# Patient Record
Sex: Male | Born: 1985
Health system: Southern US, Community
[De-identification: ages and names within clinical notes are randomized; demographics above are authoritative.]

## PROBLEM LIST (undated history)

## (undated) VITALS — BP 130/82 | HR 65 | Temp 96.7°F | Resp 24

## (undated) DIAGNOSIS — F1010 Alcohol abuse, uncomplicated: Secondary | ICD-10-CM

## (undated) DIAGNOSIS — F32A Depression, unspecified: Secondary | ICD-10-CM

## (undated) DIAGNOSIS — F329 Major depressive disorder, single episode, unspecified: Secondary | ICD-10-CM

## (undated) DIAGNOSIS — F419 Anxiety disorder, unspecified: Secondary | ICD-10-CM

## (undated) DIAGNOSIS — I10 Essential (primary) hypertension: Secondary | ICD-10-CM

## (undated) HISTORY — PX: WISDOM TOOTH EXTRACTION: SHX21

## (undated) HISTORY — DX: Anxiety disorder, unspecified: F41.9

---
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
## 2008-03-23 ENCOUNTER — Emergency Department (HOSPITAL_COMMUNITY): Admission: EM | Admit: 2008-03-23 | Discharge: 2008-03-23 | Payer: Self-pay | Admitting: Family Medicine

## 2009-03-22 ENCOUNTER — Emergency Department (HOSPITAL_COMMUNITY): Admission: EM | Admit: 2009-03-22 | Discharge: 2009-03-22 | Payer: Self-pay | Admitting: Emergency Medicine

## 2009-03-28 ENCOUNTER — Emergency Department (HOSPITAL_COMMUNITY): Admission: EM | Admit: 2009-03-28 | Discharge: 2009-03-28 | Payer: Self-pay | Admitting: Emergency Medicine

## 2010-06-02 ENCOUNTER — Emergency Department (HOSPITAL_COMMUNITY): Admission: EM | Admit: 2010-06-02 | Discharge: 2010-06-02 | Payer: Self-pay | Admitting: Emergency Medicine

## 2010-06-14 ENCOUNTER — Ambulatory Visit: Payer: Self-pay | Admitting: Family Medicine

## 2010-09-09 ENCOUNTER — Other Ambulatory Visit: Payer: Self-pay | Admitting: Family Medicine

## 2010-09-09 ENCOUNTER — Ambulatory Visit
Admission: RE | Admit: 2010-09-09 | Discharge: 2010-09-09 | Payer: Self-pay | Source: Home / Self Care | Attending: Family Medicine | Admitting: Family Medicine

## 2010-09-09 DIAGNOSIS — F524 Premature ejaculation: Secondary | ICD-10-CM | POA: Insufficient documentation

## 2010-09-09 DIAGNOSIS — R030 Elevated blood-pressure reading, without diagnosis of hypertension: Secondary | ICD-10-CM | POA: Insufficient documentation

## 2010-09-09 DIAGNOSIS — F1021 Alcohol dependence, in remission: Secondary | ICD-10-CM | POA: Insufficient documentation

## 2010-09-10 LAB — LIPID PANEL
Cholesterol: 153 mg/dL (ref 0–200)
HDL: 85.9 mg/dL (ref >39.00)
LDL Cholesterol: 62 mg/dL (ref 0–99)
Total CHOL/HDL Ratio: 2
Triglycerides: 24 mg/dL (ref 0.0–149.0)
VLDL: 4.8 mg/dL (ref 0.0–40.0)

## 2010-10-03 NOTE — Assessment & Plan Note (Signed)
Summary: NEW TO EST//ALP   Vital Signs:  Patient profile:   25 year old male Height:      68.5 inches Weight:      148 pounds BMI:     22.26 Temp:     97.9 degrees F oral Pulse rate:   80 / minute Pulse rhythm:   regular Resp:     12 per minute BP sitting:   138 / 90  (left arm) Cuff size:   regular  Vitals Entered By: Sid Falcon LPN (September 09, 2010 3:20 PM)   History of Present Illness: New to establish care.  Pt has no real chronic med problems. No hx of surgeries.   No medications and no allergies. New issue of premature ejaculation.  Normal libido and no ED. Symptom onset about one year ago.  Past hx of ETOH abuse and abstinent for one year.  FH signif for Fa premature CAD.  PT has never had lipids checked.    Preventive Screening-Counseling & Management  Alcohol-Tobacco     Smoking Status: current     Packs/Day: 0.5  Caffeine-Diet-Exercise     Does Patient Exercise: no  Allergies (verified): No Known Drug Allergies  Past History:  Family History: Last updated: 09/09/2010 Father, type ll diabetes, heart disease age 55 Brother, hypertension Paternal grandfather, heart disease age 92  Social History: Last updated: 09/09/2010 Current Smoker Alcohol use-no Past hx ETOH abuse. Regular exercise-no  Risk Factors: Exercise: no (09/09/2010)  Risk Factors: Smoking Status: current (09/09/2010) Packs/Day: 0.5 (09/09/2010)  Past Medical History: Past hx of ETOH abuse PMH-FH-SH reviewed for relevance  Family History: Father, type ll diabetes, heart disease age 62 Brother, hypertension Paternal grandfather, heart disease age 13  Social History: Current Smoker Alcohol use-no Past hx ETOH abuse. Regular exercise-no Smoking Status:  current Packs/Day:  0.5 Does Patient Exercise:  no  Review of Systems  The patient denies anorexia, fever, weight loss, weight gain, hoarseness, chest pain, syncope, dyspnea on exertion, peripheral edema,  prolonged cough, headaches, hemoptysis, abdominal pain, melena, hematochezia, severe indigestion/heartburn, incontinence, muscle weakness, and depression.    Physical Exam  General:  Well-developed,well-nourished,in no acute distress; alert,appropriate and cooperative throughout examination Head:  Normocephalic and atraumatic without obvious abnormalities. No apparent alopecia or balding. Eyes:  pupils equal, pupils round, and pupils reactive to light.   Ears:  External ear exam shows no significant lesions or deformities.  Otoscopic examination reveals clear canals, tympanic membranes are intact bilaterally without bulging, retraction, inflammation or discharge. Hearing is grossly normal bilaterally. Mouth:  Oral mucosa and oropharynx without lesions or exudates.  Teeth in good repair. Neck:  No deformities, masses, or tenderness noted. Lungs:  Normal respiratory effort, chest expands symmetrically. Lungs are clear to auscultation, no crackles or wheezes. Heart:  Normal rate and regular rhythm. S1 and S2 normal without gallop, murmur, click, rub or other extra sounds. Abdomen:  soft, non-tender, and normal bowel sounds.   Extremities:  No clubbing, cyanosis, edema, or deformity noted with normal full range of motion of all joints.   Neurologic:  alert & oriented X3, cranial nerves II-XII intact, and strength normal in all extremities.   Skin:  no rashes and no suspicious lesions.   Cervical Nodes:  No lymphadenopathy noted   Impression & Recommendations:  Problem # 1:  ELEVATED BLOOD PRESSURE (ICD-796.2) discussed Na reduction and close monitoring Orders: TLB-Lipid Panel (80061-LIPID) Specimen Handling (16109) Venipuncture (60454)  Problem # 2:  PREMATURE EJACULATION (ICD-302.75) trial of Fluoxetine 10  mg daily and titrate in 2-3 weeks as needed  Problem # 3:  ALCOHOL ABUSE, HX OF (ICD-V11.3)  Complete Medication List: 1)  Fluoxetine Hcl 10 Mg Caps (Fluoxetine hcl) .... One by  mouth once daily  Patient Instructions: 1)  Check your  Blood Pressure regularly . If it is above: 130/80  you should make an appointment. 2)  Limit your Sodium(salt) .  3)  Stop smoking tips: Choose a quit date. Cut down before the quit date. Decide what you will do as a substitute when you feel the urge to smoke(gum, toothpick, exercise).  4)  Call in 2-3 weeks if premature ejaculation not improving. Prescriptions: FLUOXETINE HCL 10 MG CAPS (FLUOXETINE HCL) one by mouth once daily  #30 x 5   Entered and Authorized by:   Evelena Peat MD   Signed by:   Evelena Peat MD on 09/09/2010   Method used:   Electronically to        [facility] (retail)       [address]       [place], [place]  [address]       Ph: [phone]       Fax: [phone]   RxID:   [id number]    Orders Added: 1)  TLB-Lipid Panel [80061-LIPID] 2)  Specimen Handling [99000] 3)  Venipuncture [16606] 4)  New Patient Level III [30160]    Preventive Care Screening  Last Tetanus Booster:    Date:  09/01/2009    Results:  Historical

## 2011-12-02 ENCOUNTER — Emergency Department (HOSPITAL_COMMUNITY)
Admission: EM | Admit: 2011-12-02 | Discharge: 2011-12-03 | Disposition: A | Discharge status: Other Acute Inpatient Hospital | Payer: 59 | Attending: Emergency Medicine | Admitting: Emergency Medicine

## 2011-12-02 ENCOUNTER — Encounter (HOSPITAL_COMMUNITY): Payer: Self-pay

## 2011-12-02 DIAGNOSIS — F1021 Alcohol dependence, in remission: Secondary | ICD-10-CM

## 2011-12-02 DIAGNOSIS — F1010 Alcohol abuse, uncomplicated: Secondary | ICD-10-CM | POA: Insufficient documentation

## 2011-12-02 DIAGNOSIS — R51 Headache: Secondary | ICD-10-CM | POA: Insufficient documentation

## 2011-12-02 DIAGNOSIS — R259 Unspecified abnormal involuntary movements: Secondary | ICD-10-CM | POA: Insufficient documentation

## 2011-12-02 LAB — RAPID URINE DRUG SCREEN, HOSP PERFORMED
Amphetamines: NOT DETECTED
Barbiturates: NOT DETECTED
Benzodiazepines: NOT DETECTED
Cocaine: NOT DETECTED
Opiates: NOT DETECTED
Tetrahydrocannabinol: NOT DETECTED

## 2011-12-02 LAB — COMPREHENSIVE METABOLIC PANEL
ALT: 62 U/L — ABNORMAL HIGH (ref 0–53)
AST: 96 U/L — ABNORMAL HIGH (ref 0–37)
Albumin: 4.3 g/dL (ref 3.5–5.2)
Alkaline Phosphatase: 65 U/L (ref 39–117)
BUN: 7 mg/dL (ref 6–23)
CO2: 31 mEq/L (ref 19–32)
Calcium: 9.8 mg/dL (ref 8.4–10.5)
Chloride: 97 mEq/L (ref 96–112)
Creatinine, Ser: 0.88 mg/dL (ref 0.50–1.35)
GFR calc Af Amer: >90 mL/min (ref >90)
GFR calc non Af Amer: >90 mL/min (ref >90)
Glucose, Bld: 96 mg/dL (ref 70–99)
Potassium: 3.7 mEq/L (ref 3.5–5.1)
Sodium: 136 mEq/L (ref 135–145)
Total Bilirubin: 0.3 mg/dL (ref 0.3–1.2)
Total Protein: 7.6 g/dL (ref 6.0–8.3)

## 2011-12-02 LAB — ETHANOL: Alcohol, Ethyl (B): <11 mg/dL (ref 0–11)

## 2011-12-02 LAB — CBC
HCT: 39.8 % (ref 39.0–52.0)
Hemoglobin: 13.8 g/dL (ref 13.0–17.0)
MCH: 33.9 pg (ref 26.0–34.0)
MCHC: 34.7 g/dL (ref 30.0–36.0)
MCV: 97.8 fL (ref 78.0–100.0)
Platelets: 221 10*3/uL (ref 150–400)
RBC: 4.07 MIL/uL — ABNORMAL LOW (ref 4.22–5.81)
RDW: 12.9 % (ref 11.5–15.5)
WBC: 8.4 10*3/uL (ref 4.0–10.5)

## 2011-12-02 MED ORDER — FOLIC ACID 1 MG PO TABS
1.0000 mg | ORAL_TABLET | Freq: Every day | ORAL | Status: DC
  Administered 2011-12-02: 1 mg via ORAL
  Filled 2011-12-02: qty 1

## 2011-12-02 MED ORDER — VITAMIN B-1 100 MG PO TABS
100.0000 mg | ORAL_TABLET | Freq: Every day | ORAL | Status: DC
  Administered 2011-12-02: 100 mg via ORAL
  Filled 2011-12-02: qty 1

## 2011-12-02 MED ORDER — LORAZEPAM 1 MG PO TABS: 1.0000 mg | ORAL_TABLET | Freq: Four times a day (QID) | ORAL | Status: DC | PRN

## 2011-12-02 MED ORDER — THIAMINE HCL 100 MG/ML IJ SOLN: 100.0000 mg | Freq: Every day | INTRAMUSCULAR | Status: DC

## 2011-12-02 MED ORDER — LORAZEPAM 2 MG/ML IJ SOLN: 1.0000 mg | Freq: Four times a day (QID) | INTRAMUSCULAR | Status: DC | PRN

## 2011-12-02 MED ORDER — ONDANSETRON HCL 4 MG PO TABS: 4.0000 mg | ORAL_TABLET | Freq: Three times a day (TID) | ORAL | Status: DC | PRN

## 2011-12-02 MED ORDER — ADULT MULTIVITAMIN W/MINERALS CH
1.0000 | ORAL_TABLET | Freq: Every day | ORAL | Status: DC
  Administered 2011-12-02: 1 via ORAL
  Filled 2011-12-02: qty 1

## 2011-12-02 MED ORDER — NICOTINE 21 MG/24HR TD PT24
21.0000 mg | MEDICATED_PATCH | Freq: Every day | TRANSDERMAL | Status: DC
  Filled 2011-12-02: qty 1

## 2011-12-02 MED ORDER — ZOLPIDEM TARTRATE 5 MG PO TABS: 5.0000 mg | ORAL_TABLET | Freq: Every evening | ORAL | Status: DC | PRN

## 2011-12-02 MED ORDER — LORAZEPAM 1 MG PO TABS
1.0000 mg | ORAL_TABLET | Freq: Three times a day (TID) | ORAL | Status: DC | PRN
  Administered 2011-12-02: 1 mg via ORAL
  Filled 2011-12-02: qty 1

## 2011-12-02 NOTE — ED Notes (Signed)
Pt here voluntarily for alcohol detox, last drink last night, pt not suicidal

## 2011-12-02 NOTE — BH Assessment (Signed)
Assessment Note   Jordan Hudson is an 26 y.o. male. Pt reported to the [facility] for detox from alcohol. Pt states that he would like to enter a residential program at [facility] and was instructed to detox first since he was experiencing withdrawal symptoms. Pt presents as very pleasant and calm during assessment. Pt states that he has been drinking 3-4 40oz beers daily for the past 8 months. Pt reports using alcohol since the age of 5 with the longest period of sobriety being 1 yr (2010-2011). Pt's last use of alcohol was 12/01/11 in the amount of approximately 100oz of malt liquor. Pt reports that he stopped drinking around 11pm on 12/01/11. Pt's current withdrawal symptoms include: pressure on chest, shaking, headache and chills. Pt denies using any other substances. Pt states that the increase in alcohol was triggered by the break up with his girlfriend/mother of his 2 y/o daughter. Pt also reports feelings of depression over the last 8 months with symptoms that include: insomnia, loss of interest in usual pleasures and isolating himself from others. Pt states that he is only able to sleep when he drinks alcohol and over the last 8 months he has only slept 4 hours nightly. Pt appears motivated for treatment stating, "I'm tired of feeling this way and I am starting to worry about my health because of the alcohol." Pt denies any SI/HI/AVH.  Pt states that he is currently on a 3 yr probation for 2 DUI's he was charged with in 2009 and 2010. Pt states that he has 7 months left on probation and he has no new/pending charges or court dates.    Pt information is being sent to [facility] for review.   Axis I: Alcohol Dependence, Mood Disorder NOS Axis II: Deferred Axis III: History reviewed. No pertinent past medical history. Axis IV: other psychosocial or environmental problems and problems related to social environment Axis V: 61-70 mild symptoms  Past Medical History: History reviewed. No pertinent past medical  history.  History reviewed. No pertinent past surgical history.  Family History: History reviewed. No pertinent family history.  Social History:  does not have a smoking history on file. He does not have any smokeless tobacco history on file. He reports that he drinks alcohol. His drug history not on file.  Additional Social History:    Allergies: No Known Allergies  Home Medications:  Medications Prior to Admission  Medication Dose Route Frequency Provider Last Rate Last Dose  . folic acid (FOLVITE) tablet 1 mg  1 mg Oral Daily Heather Van Wingen, PA-C      . LORazepam (ATIVAN) tablet 1 mg  1 mg Oral Q6H PRN Pascal Lux Wingen, PA-C       Or  . LORazepam (ATIVAN) injection 1 mg  1 mg Intravenous Q6H PRN Pascal Lux Wingen, PA-C      . LORazepam (ATIVAN) tablet 1 mg  1 mg Oral Q8H PRN Pascal Lux Wingen, PA-C      . mulitivitamin with minerals tablet 1 tablet  1 tablet Oral Daily Heather Van Wingen, PA-C      . nicotine (NICODERM CQ - dosed in mg/24 hours) patch 21 mg  21 mg Transdermal Daily Heather Van Wingen, PA-C      . ondansetron [facility]) tablet 4 mg  4 mg Oral Q8H PRN Pascal Lux Wingen, PA-C      . thiamine (VITAMIN B-1) tablet 100 mg  100 mg Oral Daily Heather Anne Shutter, PA-C  Or  . thiamine (B-1) injection 100 mg  100 mg Intravenous Daily Heather Van Wingen, PA-C      . zolpidem (AMBIEN) tablet 5 mg  5 mg Oral QHS PRN Pascal Lux Wingen, PA-C       No current outpatient prescriptions on file as of 12/02/2011.    OB/GYN Status:  No LMP for male patient.  General Assessment Data Location of Assessment: [facility] ED Living Arrangements: Parent (sister also in the home) Can pt return to current living arrangement?: Yes Admission Status: Voluntary Is patient capable of signing voluntary admission?: Yes Transfer from: Acute Hospital Referral Source: Self/Family/Friend  Education Status Is patient currently in school?: No  Risk to self Suicidal Ideation: No Suicidal  Intent: No Is patient at risk for suicide?: No Suicidal Plan?: No Access to Means: No What has been your use of drugs/alcohol within the last 12 months?: ETOH: 3-4 40oz beers daily for 8 months; Last use 12/02/11 100oz malt liquor Previous Attempts/Gestures: No How many times?: 0  Other Self Harm Risks: none reported Triggers for Past Attempts: None known Intentional Self Injurious Behavior: None Family Suicide History: No Recent stressful life event(s): Other (Comment) (breakup with girlfriend 8 months ago) Persecutory voices/beliefs?: No Depression: Yes Depression Symptoms: Insomnia;Isolating;Loss of interest in usual pleasures Substance abuse history and/or treatment for substance abuse?: Yes Suicide prevention information given to non-admitted patients: Not applicable  Risk to Others Homicidal Ideation: No Thoughts of Harm to Others: No Current Homicidal Intent: No Current Homicidal Plan: No Access to Homicidal Means: No Identified Victim: none reported History of harm to others?: No Assessment of Violence: None Noted Violent Behavior Description: pt calm and cooperative during assessment Does patient have access to weapons?: No Criminal Charges Pending?: No Does patient have a court date: No  Psychosis Hallucinations: None noted Delusions: None noted  Mental Status Report Appear/Hygiene:  (appropriate to circumstances) Eye Contact: Good Motor Activity: Unremarkable Speech: Logical/coherent Level of Consciousness: Alert Mood: Anxious;Other (Comment) (appropriate to circumstances) Affect: Appropriate to circumstance Anxiety Level: Minimal Thought Processes: Relevant;Coherent Judgement: Unimpaired Orientation: Person;Place;Time;Situation Obsessive Compulsive Thoughts/Behaviors: None  Cognitive Functioning Concentration: Normal Memory: Recent Intact;Remote Intact IQ: Average Insight: Good Impulse Control: Fair Appetite: Good Weight Loss: 0  Weight Gain: 0    Sleep: Decreased Total Hours of Sleep: 4  (4 hrs nightly x8 months) Vegetative Symptoms: None  Prior Inpatient Therapy Prior Inpatient Therapy: Yes Prior Therapy Dates: 2010 Prior Therapy Facilty/Provider(s): [facility]30 day program Reason for Treatment: substance abuse  Prior Outpatient Therapy Prior Outpatient Therapy: Yes Prior Therapy Dates: March 2013-present Prior Therapy Facilty/Provider(s): Dr. Rosalyn Gess at [facility] Reason for Treatment: substance abuse            Values / Beliefs Cultural Requests During Hospitalization: None Spiritual Requests During Hospitalization: None        Additional Information 1:1 In Past 12 Months?: No CIRT Risk: No Elopement Risk: No Does patient have medical clearance?: Yes     Disposition:  Disposition Disposition of Patient: Inpatient treatment program;Referred to [facility]) Type of inpatient treatment program: Adult Patient referred to: Other (Comment) [facility])  On Site Evaluation by:   Reviewed with Physician:     N[place]ada Crane F 12/02/2011 10:48 PM

## 2011-12-02 NOTE — ED Notes (Signed)
Pt reports drinking 100 oz of malt liquor last night, stopped drinking around approx 11 pm. Drinks 3-4 40 oz beers daily x 8 mo, denies SI/HI/AVH, requesting ETOH detox.

## 2011-12-02 NOTE — ED Notes (Signed)
Pt has been changed and wanded, has two belonging bags in locker 35

## 2011-12-02 NOTE — ED Notes (Signed)
SW in to speak with pt.

## 2011-12-02 NOTE — ED Notes (Signed)
Pt called [facility] & states "they told me to come detox 'cause I'm going in withdrawals"

## 2011-12-02 NOTE — ED Provider Notes (Signed)
History     CSN: [id number]  Arrival date & time 12/02/11  1818   First MD Initiated Contact with Patient 12/02/11 2113      Chief Complaint  Patient presents with  . Drug / Alcohol Assessment    (Consider location/radiation/quality/duration/timing/severity/associated sxs/prior treatment) HPI Comments: Patient is here requesting detox from alcohol.  He called [facility] to start an inpatient rehab program and was told that he needed to go through detox first.  He reports that he drinks 3-4 40oz bottles of malt liquor/day for the past 8 months.  He reports that he has been drinking alcohol regularly for the past 8 years, but drinking has escalated over the past 8 months.  He has never gone through detox before and has never experienced alcohol withdrawal before.  He is currently having a headache and tremors.  He reports that the last alcohol drink that he had was at 11pm last evening.  He denies any recreational drug use.  He denies any SI or HI.  Patient is a 26 y.o. male presenting with drug/alcohol assessment. The history is provided by the patient.  Drug / Alcohol Assessment Primary symptoms include no confusion, no loss of consciousness, no seizures, no agitation, no hallucinations and no self-injury. Suspected agents include alcohol. Pertinent negatives include no fever, no nausea and no vomiting.    History reviewed. No pertinent past medical history.  History reviewed. No pertinent past surgical history.  History reviewed. No pertinent family history.  History  Substance Use Topics  . Smoking status: Not on file  . Smokeless tobacco: Not on file  . Alcohol Use: Yes      Review of Systems  Constitutional: Negative for fever, chills and diaphoresis.  Eyes: Negative for visual disturbance.  Respiratory: Negative for shortness of breath.   Cardiovascular: Negative for chest pain.  Gastrointestinal: Negative for nausea and vomiting.  Neurological: Positive for tremors and  headaches. Negative for seizures and loss of consciousness.  Psychiatric/Behavioral: Negative for suicidal ideas, hallucinations, confusion, self-injury, dysphoric mood and agitation. The patient is not nervous/anxious.     Allergies  Review of patient's allergies indicates no known allergies.  Home Medications   Current Outpatient Rx  Name Route Sig Dispense Refill  . IBUPROFEN 200 MG PO TABS Oral Take 800 mg by mouth every 6 (six) hours as needed.      BP 161/93  Pulse 73  Temp(Src) 98.8 F (37.1 C) (Oral)  Resp 18  SpO2 98%  Physical Exam  Nursing note and vitals reviewed. Constitutional: He appears well-developed and well-nourished. No distress.  HENT:  Head: Normocephalic and atraumatic.  Mouth/Throat: Oropharynx is clear and moist.  Eyes: EOM are normal. Pupils are equal, round, and reactive to light.  Neck: Normal range of motion. Neck supple.  Cardiovascular: Normal rate, regular rhythm and normal heart sounds.   Pulmonary/Chest: Effort normal and breath sounds normal.  Musculoskeletal: Normal range of motion.  Neurological: He is alert.  Skin: Skin is warm and dry. He is not diaphoretic.  Psychiatric: He has a normal mood and affect. His speech is normal and behavior is normal. Thought content normal. His mood appears not anxious. He is not actively hallucinating. Cognition and memory are normal. He does not exhibit a depressed mood. He expresses no homicidal and no suicidal ideation. He expresses no suicidal plans and no homicidal plans.    ED Course  Procedures (including critical care time)  Labs Reviewed  CBC - Abnormal; Notable for the following:  RBC 4.07 (*)    All other components within normal limits  COMPREHENSIVE METABOLIC PANEL - Abnormal; Notable for the following:    AST 96 (*) NO VISIBLE HEMOLYSIS   ALT 62 (*)    All other components within normal limits  ETHANOL  URINE RAPID DRUG SCREEN (HOSP PERFORMED)   No results found.   No  diagnosis found.  Patient discussed with ACT team, who will evaluate patient and determine placement.  MDM  Patient comes in today requesting alcohol detox.  ACT team has been consulted and will determine placement for patient.  VSS at this time.  Patient put on CIWA protocol and placed in the Psych ED.        Pascal Lux Arthur, PA-C 12/03/11 0045

## 2011-12-03 ENCOUNTER — Inpatient Hospital Stay (HOSPITAL_COMMUNITY)
Admission: EM | Admit: 2011-12-03 | Discharge: 2011-12-05 | DRG: 897 | Disposition: A | Discharge status: Home / Self Care | Payer: 59 | Source: Ambulatory Visit | Attending: Psychiatry | Admitting: Psychiatry

## 2011-12-03 ENCOUNTER — Encounter (HOSPITAL_COMMUNITY): Payer: Self-pay | Admitting: *Deleted

## 2011-12-03 DIAGNOSIS — Z6379 Other stressful life events affecting family and household: Secondary | ICD-10-CM

## 2011-12-03 DIAGNOSIS — F3289 Other specified depressive episodes: Secondary | ICD-10-CM

## 2011-12-03 DIAGNOSIS — F1021 Alcohol dependence, in remission: Secondary | ICD-10-CM

## 2011-12-03 DIAGNOSIS — F329 Major depressive disorder, single episode, unspecified: Secondary | ICD-10-CM

## 2011-12-03 DIAGNOSIS — F1020 Alcohol dependence, uncomplicated: Secondary | ICD-10-CM | POA: Diagnosis present

## 2011-12-03 DIAGNOSIS — F1010 Alcohol abuse, uncomplicated: Principal | ICD-10-CM

## 2011-12-03 HISTORY — DX: Depression, unspecified: F32.A

## 2011-12-03 HISTORY — DX: Major depressive disorder, single episode, unspecified: F32.9

## 2011-12-03 MED ORDER — LOPERAMIDE HCL 2 MG PO CAPS: 2.0000 mg | ORAL_CAPSULE | ORAL | Status: DC | PRN

## 2011-12-03 MED ORDER — CHLORDIAZEPOXIDE HCL 25 MG PO CAPS
25.0000 mg | ORAL_CAPSULE | Freq: Three times a day (TID) | ORAL | Status: AC
  Administered 2011-12-04 (×3): 25 mg via ORAL
  Filled 2011-12-03 (×3): qty 1

## 2011-12-03 MED ORDER — CHLORDIAZEPOXIDE HCL 25 MG PO CAPS: 25.0000 mg | ORAL_CAPSULE | Freq: Every day | ORAL | Status: DC

## 2011-12-03 MED ORDER — CHLORDIAZEPOXIDE HCL 25 MG PO CAPS
25.0000 mg | ORAL_CAPSULE | ORAL | Status: DC
  Administered 2011-12-05: 25 mg via ORAL
  Filled 2011-12-03: qty 1

## 2011-12-03 MED ORDER — HYDROXYZINE HCL 25 MG PO TABS: 25.0000 mg | ORAL_TABLET | Freq: Four times a day (QID) | ORAL | Status: DC | PRN

## 2011-12-03 MED ORDER — VITAMIN B-1 100 MG PO TABS
100.0000 mg | ORAL_TABLET | Freq: Every day | ORAL | Status: DC
  Administered 2011-12-04 – 2011-12-05 (×2): 100 mg via ORAL
  Filled 2011-12-03 (×4): qty 1

## 2011-12-03 MED ORDER — ADULT MULTIVITAMIN W/MINERALS CH
1.0000 | ORAL_TABLET | Freq: Every day | ORAL | Status: DC
  Administered 2011-12-03 – 2011-12-05 (×3): 1 via ORAL
  Filled 2011-12-03 (×5): qty 1

## 2011-12-03 MED ORDER — ONDANSETRON 4 MG PO TBDP: 4.0000 mg | ORAL_TABLET | Freq: Four times a day (QID) | ORAL | Status: DC | PRN

## 2011-12-03 MED ORDER — TRAZODONE HCL 50 MG PO TABS
50.0000 mg | ORAL_TABLET | Freq: Once | ORAL | Status: DC
  Filled 2011-12-03: qty 1

## 2011-12-03 MED ORDER — MAGNESIUM HYDROXIDE 400 MG/5ML PO SUSP: 30.0000 mL | Freq: Every day | ORAL | Status: DC | PRN

## 2011-12-03 MED ORDER — ACETAMINOPHEN 325 MG PO TABS
650.0000 mg | ORAL_TABLET | Freq: Four times a day (QID) | ORAL | Status: DC | PRN
  Administered 2011-12-04 – 2011-12-05 (×2): 650 mg via ORAL
  Filled 2011-12-03: qty 2

## 2011-12-03 MED ORDER — THIAMINE HCL 100 MG/ML IJ SOLN: 100.0000 mg | Freq: Once | INTRAMUSCULAR | Status: DC

## 2011-12-03 MED ORDER — CHLORDIAZEPOXIDE HCL 25 MG PO CAPS
25.0000 mg | ORAL_CAPSULE | Freq: Four times a day (QID) | ORAL | Status: AC
  Administered 2011-12-03 (×4): 25 mg via ORAL
  Filled 2011-12-03 (×4): qty 1

## 2011-12-03 MED ORDER — CHLORDIAZEPOXIDE HCL 25 MG PO CAPS: 25.0000 mg | ORAL_CAPSULE | Freq: Four times a day (QID) | ORAL | Status: DC | PRN

## 2011-12-03 MED ORDER — CHLORDIAZEPOXIDE HCL 25 MG PO CAPS
50.0000 mg | ORAL_CAPSULE | Freq: Once | ORAL | Status: AC
  Administered 2011-12-03: 50 mg via ORAL
  Filled 2011-12-03: qty 2

## 2011-12-03 MED ORDER — ALUM & MAG HYDROXIDE-SIMETH 200-200-20 MG/5ML PO SUSP: 30.0000 mL | ORAL | Status: DC | PRN

## 2011-12-03 MED ORDER — DIPHENHYDRAMINE HCL 50 MG PO CAPS
50.0000 mg | ORAL_CAPSULE | Freq: Once | ORAL | Status: AC
  Administered 2011-12-03: 50 mg via ORAL
  Filled 2011-12-03: qty 1

## 2011-12-03 NOTE — Tx Team (Signed)
Initial Interdisciplinary Treatment Plan  PATIENT STRENGTHS: (choose at least two) Ability for insight Average or above average intelligence Capable of independent living Communication skills Financial means Motivation for treatment/growth Physical Health Religious Affiliation Special hobby/interest Supportive family/friends  PATIENT STRESSORS: Legal issue Marital or family conflict   PROBLEM LIST: Problem List/Patient Goals Date to be addressed Date deferred Reason deferred Estimated date of resolution  "I wanna stop drinking" 12/03/11     Need to detox first in order to go to rehab 12/03/11                 Alcohol abuse 12/03/11     Increased risk for suicide 12/03/11                        DISCHARGE CRITERIA:  Ability to meet basic life and health needs Adequate post-discharge living arrangements Improved stabilization in mood, thinking, and/or behavior Medical problems require only outpatient monitoring Motivation to continue treatment in a less acute level of care Need for constant or close observation no longer present Reduction of life-threatening or endangering symptoms to within safe limits Safe-care adequate arrangements made Verbal commitment to aftercare and medication compliance Withdrawal symptoms are absent or subacute and managed without 24-hour nursing intervention  PRELIMINARY DISCHARGE PLAN: Attend aftercare/continuing care group Attend PHP/IOP Attend 12-step recovery group Participate in family therapy Return to previous work or school arrangements  PATIENT/FAMIILY INVOLVEMENT: This treatment plan has been presented to and reviewed with the patient, Jordan Hudson, and/or family member.  The patient and family have been given the opportunity to ask questions and make suggestions.  Fransico Michael Laverne 12/03/2011, 4:06 AM

## 2011-12-03 NOTE — [facility] Suicide Risk Assessment (Signed)
Suicide Risk Assessment  Admission Assessment     Demographic factors:  Assessment Details Time of Assessment: Admission Information Obtained From: Patient Current Mental Status:    Loss Factors:  Loss Factors: Legal issues;Loss of significant relationship (broke up with girlfriend 8 months ago) Historical Factors:    Risk Reduction Factors:  Risk Reduction Factors: Responsible for children under 27 years of age;Sense of responsibility to family;Religious beliefs about death;Employed;Living with another person, especially a relative;Positive social support;Positive therapeutic relationship  CLINICAL FACTORS:   Depression:   Anhedonia Hopelessness Severe Dysthymia Alcohol/Substance Abuse/Dependencies More than one psychiatric diagnosis Previous Psychiatric Diagnoses and Treatments  COGNITIVE FEATURES THAT CONTRIBUTE TO RISK:  Polarized thinking    SUICIDE RISK: Denies suicidal ideation "I love my life"   PLAN OF CARE:   Jordan Hudson has requested detox for alcohol.  Jordan Hudson drinks 3-40 oz beer daily for the past 8 mos.  Jordan Hudson and fiance, have a 2 yo daughter, Jordan Hudson. They broke up over his drinking.  Jordan Hudson says Jordan Hudson cannot stop.  Jordan Hudson gets withdrawal of chills, chest pain, headaches and 'the shakes' and wants ehlp to stop drinking.  Jordan Hudson has a significant family history of alcoholism: 2 maternal aunts [1 died of cirrhosis], his maternal uncles and his father.  Jordan Hudson smokes cigarettes 1/2 ppd.  Jordan Hudson stopped smoking marijuana 'cold [place]' with no problem  Jordan Hudson wants to detox, go to rehab and enroll in nursing training.  Jordan Hudson has had 2 DUI charges with suspended license.  Jordan Hudson is on probation until October 2013 and then get his license again.  Jordan Hudson sees a Engineer, drilling, goes to [facility] to see a doctor and counselor.  Jordan Hudson needs to participate in group and individual therapy while on the inpatient unit.Marland Kitchen  Jordan Hudson may need to contact his PO, make plans for a rehab program.  On discharge Jordan Hudson is recommended to AA program and  encouraged to get a sponsor, continue with a psychiatrist and therapist and inquire about nurse training programs.     Jordan Hudson 12/03/2011, 11:43 AM

## 2011-12-03 NOTE — Progress Notes (Signed)
Pt has been up and active in the milieu today.  He rated his depression a 5 hopelessness a 3 and denied any anxiety on his self-inventory.  He denied any S/H ideation or A/V hallucinations.  He has been up for groups with some participation.  He denies any symptoms of withdrawal thus far.  He did c/o some chills but feels it is from the cool temperature of the hospital.  He is wanting to go to long-term treatment from here.  He is willing to go anywhere that will accept his at this point.  He stated,"I have my three-year-old to think about.  I want to get sober for me and her"

## 2011-12-03 NOTE — H&P (Signed)
Psychiatric Admission Assessment Adult  Patient Identification:  Jordan Hudson Date of Evaluation:  12/03/2011 Chief Complaint:  ALCOHOL DEPENDENCE History of Present Illness: Pt. Went to [facility] and was referred to the ED for assistance with detoxing from alcohol.  He states he is tired of living this way and is asking for help.  He denies any problems with SI/HI but states he wants to try living without alcohol in his life and is ready for a change. He has been drinking 3-4 40oz beers a day for months.  He denies any history of seizures with withdrawal and states, "I don't recall being sober in the last 10 years."  Psychiatric Symptoms:  Worsening over the last 8 mohths.  NO SI/HI, no prior attempts. Hx of Trauma: (Emotional/Phsycial/Sexual) denies Past Psychiatric History: none Past Medical History:   Past Medical History  Diagnosis Date  . Depression     8 months   Allergies:  No Known Allergies  PTA Medications: Prescriptions prior to admission  Medication Sig Dispense Refill  . ibuprofen (ADVIL,MOTRIN) 200 MG tablet Take 800 mg by mouth every 6 (six) hours as needed.        Previous Psychotropic Medications: denies.   Substance Abuse History in the last 12 months: + 5 ppd tobacco   Social History: Current Place of Residence:   Place of Birth:   Employment: Marital Status:   Children: Education:   Hotel manager History:   Legal History: Family History:  History reviewed. No pertinent family history.  ROS: As noted in the HPI. PE: Completed in ED. Pt evaluated and results reviewed.  Mental Status Examination/Evaluation: Appearance: casual, calm and cooperative  Eye Contact::  good  Speech:  clear  Volume:  normal  Mood:  euthymic  Affect:  congruent  Thought Process:  linear  Orientation:  x3  Thought Content:  Normal, no AH/VH  Suicidal Thoughts:  no  Homicidal Thoughts:  no  Memory:  fair  Judgement:  fair  Insight:  fair  Psychomotor Activity:  normal    Concentration:  fair  Recall:  fair  Akathisia:  no  Handed:  Right  AIMS (if indicated):     Assets:    Sleep:  Number of Hours: 1.25    Labs: Xray:Results for Jordan Hudson, Jordan Hudson (MRN [id number]) as of 12/04/2011 11:03  Ref. Range 12/02/2011 19:32  Sodium Latest Range: 135-145 mEq/L 136  Potassium Latest Range: 3.5-5.1 mEq/L 3.7  Chloride Latest Range: 96-112 mEq/L 97  CO2 Latest Range: 19-32 mEq/L 31  BUN Latest Range: 6-23 mg/dL 7  Creat Latest Range: 0.50-1.35 mg/dL 4.09  Calcium Latest Range: 8.4-10.5 mg/dL 9.8  GFR calc non Af Amer Latest Range: >90 mL/min >90  GFR calc Af Amer Latest Range: >90 mL/min >90  Glucose Latest Range: 70-99 mg/dL 96  Alkaline Phosphatase Latest Range: 39-117 U/L 65  Albumin Latest Range: 3.5-5.2 g/dL 4.3  AST Latest Range: 0-37 U/L 96 (H)  ALT Latest Range: 0-53 U/L 62 (H)  Total Protein Latest Range: 6.0-8.3 g/dL 7.6  Total Bilirubin Latest Range: 0.3-1.2 mg/dL 0.3  WBC Latest Range: 4.0-10.5 K/uL 8.4  RBC Latest Range: 4.22-5.81 MIL/uL 4.07 (L)  HGB Latest Range: 13.0-17.0 g/dL 81.1  HCT Latest Range: 39.0-52.0 % 39.8  MCV Latest Range: 78.0-100.0 fL 97.8  MCH Latest Range: 26.0-34.0 pg 33.9  MCHC Latest Range: 30.0-36.0 g/dL 91.4  RDW Latest Range: 11.5-15.5 % 12.9  Platelets Latest Range: 150-400 K/uL 221  Alcohol, Ethyl (B) Latest Range: 0-11  mg/dL <78  Assessment:    AXIS I:  Alcohol dependence, chronic and continueous AXIS II:  deferred AXIS III:  HTN, ED AXIS IV:  Problems with primary support groupt AXIS V:  GAF: 55  Treatment Plan/Recommendations: Admit for crisis stabilization and supportive care to include detox protocol for alcohol dependence as needed. Evaluation and treatment for medical problems associated with current state of health.  Current Medications:  Current Facility-Administered Medications  Medication Dose Route Frequency Provider Last Rate Last Dose  . acetaminophen (TYLENOL) tablet 650 mg  650 mg Oral Q6H PRN  Verne Spurr, PA-C      . alum & mag hydroxide-simeth (MAALOX/MYLANTA) 200-200-20 MG/5ML suspension 30 mL  30 mL Oral Q4H PRN Verne Spurr, PA-C      . chlordiazePOXIDE (LIBRIUM) capsule 25 mg  25 mg Oral Q6H PRN Verne Spurr, PA-C      . chlordiazePOXIDE (LIBRIUM) capsule 25 mg  25 mg Oral QID Verne Spurr, PA-C   25 mg at 12/03/11 1149   Followed by  . chlordiazePOXIDE (LIBRIUM) capsule 25 mg  25 mg Oral TID Verne Spurr, PA-C       Followed by  . chlordiazePOXIDE (LIBRIUM) capsule 25 mg  25 mg Oral BH-qamhs Verne Spurr, PA-C       Followed by  . chlordiazePOXIDE (LIBRIUM) capsule 25 mg  25 mg Oral Daily Verne Spurr, PA-C      . chlordiazePOXIDE (LIBRIUM) capsule 50 mg  50 mg Oral Once Verne Spurr, PA-C   50 mg at 12/03/11 0601  . hydrOXYzine (ATARAX/VISTARIL) tablet 25 mg  25 mg Oral Q6H PRN Verne Spurr, PA-C      . loperamide (IMODIUM) capsule 2-4 mg  2-4 mg Oral PRN Verne Spurr, PA-C      . magnesium hydroxide (MILK OF MAGNESIA) suspension 30 mL  30 mL Oral Daily PRN Verne Spurr, PA-C      . mulitivitamin with minerals tablet 1 tablet  1 tablet Oral Daily Verne Spurr, PA-C   1 tablet at 12/03/11 0804  . ondansetron (ZOFRAN-ODT) disintegrating tablet 4 mg  4 mg Oral Q6H PRN Verne Spurr, PA-C      . thiamine (B-1) injection 100 mg  100 mg Intramuscular Once [employer], PA-C      . thiamine (VITAMIN B-1) tablet 100 mg  100 mg Oral Daily Verne Spurr, PA-C      . traZODone (DESYREL) tablet 50 mg  50 mg Oral Once Verne Spurr, PA-C       Facility-Administered Medications Ordered in Other Encounters  Medication Dose Route Frequency Provider Last Rate Last Dose  . DISCONTD: folic acid (FOLVITE) tablet 1 mg  1 mg Oral Daily Pascal Lux Wingen, PA-C   1 mg at 12/02/11 2248  . DISCONTD: LORazepam (ATIVAN) injection 1 mg  1 mg Intravenous Q6H PRN Pascal Lux Wingen, PA-C      . DISCONTD: LORazepam (ATIVAN) tablet 1 mg  1 mg Oral Q8H PRN Pascal Lux Wingen, PA-C   1 mg at  12/02/11 2247  . DISCONTD: LORazepam (ATIVAN) tablet 1 mg  1 mg Oral Q6H PRN Magnus Sinning, PA-C      . DISCONTD: mulitivitamin with minerals tablet 1 tablet  1 tablet Oral Daily Magnus Sinning, PA-C   1 tablet at 12/02/11 2248  . DISCONTD: nicotine (NICODERM CQ - dosed in mg/24 hours) patch 21 mg  21 mg Transdermal Daily Heather Van Wingen, PA-C      . DISCONTD: ondansetron [facility]) tablet 4  mg  4 mg Oral Q8H PRN Pascal Lux Wingen, PA-C      . DISCONTD: thiamine (B-1) injection 100 mg  100 mg Intravenous Daily Pascal Lux Wingen, PA-C      . DISCONTD: thiamine (VITAMIN B-1) tablet 100 mg  100 mg Oral Daily Pascal Lux Wingen, PA-C   100 mg at 12/02/11 2249  . DISCONTD: zolpidem (AMBIEN) tablet 5 mg  5 mg Oral QHS PRN Magnus Sinning, PA-C        Observation Level/Precautions:  Routine  Laboratory:       Routine PRN Medications: yes  Consultations:    Discharge Concerns:    Other:      Lloyd Huger T. Bayan Kushnir PAC For Dr. Mickeal Skinner 4/3/20134:41 PM

## 2011-12-03 NOTE — Progress Notes (Signed)
[facility] Group Notes:  (Counselor/Nursing/MHT/Case Management/Adjunct)  12/03/2011 3:57 PM  Type of Therapy:  1:15PM Group Therapy  Participation Level:  None  Participation Quality:  Drowsy and Inattentive  Affect:  Flat  Cognitive:  Unable to Assess  Insight:  None  Engagement in Group:  None  Engagement in Therapy:  None  Modes of Intervention:  Support and Exploration  Summary of Progress/Problems: Patient did not verbally participate in group discussion.   Wilmon Arms 12/03/2011, 3:57 PM

## 2011-12-03 NOTE — Progress Notes (Signed)
Pt attended discharge planning group and actively participated.  Pt presents with flat affect and mood.  Pt was open with sharing reason for entering the hospital.  Pt states that he has been drinking alcohol since he was 26 years old, but more heavily the past 8 months.  Pt states that he wants to get his life together and stop drinking.  Pt states that he works and goes to [employer], who referred him here for detox before going to rehab.  Pt states that he wants to go to [facility].  SW explained that pt has private insurance and would not be accepted to [facility].  SW provided pt on further information on [facility] and [facility] [place].  Pt states that he wants long term treatment after detox.  Pt states that he lives at home with his mom and sister in [place].  Pt states that he has a daughter and wants to get sober for her.  Pt ranks depression at a 7 and denies anxiety or SI.  SW will make appropriate referral for pt when pt is stable.  No further needs voiced by pt at this time.    Reyes Ivan, LCSWA 12/03/2011  11:06 AM

## 2011-12-03 NOTE — Progress Notes (Signed)
Patient was pleasant and cooperative during the assessment. Stated he's been depressed for 8 months due to breaking up with his girlfriend. "I see my life and I don't wanna die. I don't wanna drink no more". Stated he wants at least a 30 day rehab, but was told he had to come to complete detox first. Pt drinks 3-4 40's daily. Pt became tearful when discussing his girlfriend. Says that he wants to get into [facility]. Support and encouragement was offered.

## 2011-12-03 NOTE — ED Provider Notes (Signed)
Medical screening examination/treatment/procedure(s) were performed by non-physician practitioner and as supervising physician I was immediately available for consultation/collaboration.   Dayton Bailiff, MD 12/03/11 1315

## 2011-12-03 NOTE — Progress Notes (Signed)
Patient ID: Jordan Hudson, male   DOB: Mar 23, 1986, 26 y.o.   MRN: [id number] Patient was pleasant and cooperative during the assessment.  Pt stated he had no complaints, other the "jerks" when he tries to sleep.  Writer informed the evening NP and order was given benadryl. Support and encouragement was offered.

## 2011-12-03 NOTE — Progress Notes (Signed)
[facility] Group Notes:  (Counselor/Nursing/MHT/Case Management/Adjunct)  12/03/2011 1:00 PM   Type of Therapy:  Processing Group at 11:00 am  Participation Level:  Patient called out 5 minutes into group by PA  Participation Quality:  Pleasant during introductions  Affect:  Flat   Ronda Fairly, LCSWA 12/03/2011 1:02 PM

## 2011-12-04 MED ORDER — BENZOCAINE 10 % MT GEL
Freq: Four times a day (QID) | OROMUCOSAL | Status: DC | PRN
  Administered 2011-12-05 (×2): via OROMUCOSAL
  Filled 2011-12-04: qty 9.4

## 2011-12-04 NOTE — Progress Notes (Signed)
[facility] Group Notes:  (Counselor/Nursing/MHT/Case Management/Adjunct)  12/04/2011 4:12 PM  Type of Therapy:  1:15PM Group Therapy  Participation Level:  Minimal  Participation Quality:  Appropriate and Attentive  Affect:  Appropriate  Cognitive:  Alert and Appropriate  Insight:  Good  Engagement in Group:  Good  Engagement in Therapy:  Good  Modes of Intervention:  Activity, Clarification, Education, Support and Exploration  Summary of Progress/Problems: In discussing life and balance, patient chose a photo of a family to represent what life would resemble if it were balanced. Patient states he use to have a family and if he continues to drink alcohol he feels he will eventually lose his family, his daughter and fiance. Patient stated "I feel like I still have time. And I need to change my life around before it gets too late". Patient became tearful when discussing his family and how much they mean to him. Patient reported "I feel empty inside and that is the reason I drink".    Wilmon Arms 12/04/2011, 4:12 PM

## 2011-12-04 NOTE — Progress Notes (Signed)
Pt attended discharge planning group and actively participated.  Pt presents with calm mood and affect.  Pt denies having depression and anxiety today, ranking at a 0 today.  Pt denies having SI.  Pt states that he feels alive and the best he's ever felt.  Pt states that he feels 100% better today.  Pt states that he misses his daughter and feels ready to d/c.  Pt states that he'd prefer to do outpatient services upon d/c instead of inpatient now.  Pt states that he would like to do IOP.  SW will seek IOP in [place] for pt.  No further needs voiced by pt at this time.    Reyes Ivan, LCSWA 12/04/2011  9:57 AM

## 2011-12-04 NOTE — Progress Notes (Signed)
12/04/2011         Time: 1415      Group Topic/Focus: The focus of this group is on enhancing the patient's understanding of leisure, barriers to leisure, and the importance of engaging in positive leisure activities upon discharge for improved total health.  Participation Level: Active  Participation Quality: Appropriate and Attentive  Affect: Appropriate  Cognitive: Oriented   Additional Comments: Patient able to identify positive leisure interests he can engage in during his leisure time.   Varick Keys 12/04/2011 3:04 PM

## 2011-12-04 NOTE — Progress Notes (Signed)
Adult Services Patient-Family Contact/Session  Attendees:  Mother, Fernand Sorbello, by telephone at [phone]  Goal(s):  To obtain collateral information as patient currently lives with Mother  Safety Concerns:  None  Narrative:  Patient's mother appreciated call and is most concerned about patient's alcohol use and possible damage he may do to his health. Mother also shared how grateful he is that patient is seeking help for alcohol dependence. Mobile crisis unit service explained and contact information   Recommendation(s):  Ms Deshler is in agreement with patient that IOP will be a good fit for patient at discharge although patient has limitations due to work schedule, he will need to do a program in the afternoons.       Clide Dales 12/04/2011, 7:11 PM

## 2011-12-04 NOTE — Tx Team (Signed)
Interdisciplinary Treatment Plan Update (Adult)  Date:  12/04/2011  Time Reviewed:  9:44 AM   Progress in Treatment: Attending groups: Yes Participating in groups:  Yes Taking medication as prescribed: Yes Tolerating medication:  Yes Family/Significant othe contact made:  Counselor assessing for appropriate contact Patient understands diagnosis:  Yes Discussing patient identified problems/goals with staff:  Yes Medical problems stabilized or resolved:  Yes Denies suicidal/homicidal ideation: Yes Issues/concerns per patient self-inventory:  None identified Other: N/A  New problem(s) identified: None Identified  Reason for Continuation of Hospitalization: Anxiety Depression Medication stabilization Withdrawal symptoms  Interventions implemented related to continuation of hospitalization: mood stabilization, medication monitoring and adjustment, group therapy and psycho education, safety checks q 15 mins  Additional comments: N/A  Estimated length of stay: 1-2 days  Discharge Plan: Pt completing detox protcol, will do outpt services when d/c  New goal(s): N/A  Review of initial/current patient goals per problem list:    1.  Goal(s): Address substance use  Met:  No  Target date: by discharge  As evidenced by: completing detox protocol and refer to appropriate treatment  2.  Goal (s): Reduce depressive and anxiety symptoms  Met:  Yes  Target date: by discharge  As evidenced by: Reducing depression from a 10 to a 3 as reported by pt.  Pt denies having depression or anxiety today.   3.  Goal(s): Eliminate SI  Met:  Yes  Target date: by discharge  As evidenced by: Pt denies SI.   Attendees: Patient:  Jordan Hudson 12/04/2011 9:48 AM   Family:     Physician:   12/04/2011 9:44 AM   Nursing: Izola Price, RN 12/04/2011 9:44 AM   Case Manager:  Reyes Ivan, LCSWA 12/04/2011  9:44 AM   Counselor:  Ronda Fairly, LCSWA 12/04/2011  9:44 AM   Other: Tanya Nones, SW  intern 12/04/2011 9:44 AM   Other:  Verne Spurr, PA 12/04/2011 9:48 AM   Other:     Other:      Scribe for Treatment Team:   Reyes Ivan 12/04/2011 9:44 AM

## 2011-12-04 NOTE — Progress Notes (Signed)
Patient up and in the milieu today.  Attending groups and interacting well with staff and peers.  Tolerating medications well.  No breakthrough withdrawal symptoms noted.

## 2011-12-04 NOTE — Progress Notes (Signed)
Pt is pleasant and cooperative. Pt attends groups and interacts well with peers. Pt tends to stay to himself. Pt was offered support and encouragement. Pt receptive to treatment and safety maintained on unit.

## 2011-12-04 NOTE — Progress Notes (Signed)
[facility] Group Notes:  (Counselor/Nursing/MHT/Case Management/Adjunct)  12/04/2011 6:54 PM  Type of Therapy:  Group Therapy at 11:00  Participation Level:  Minimal  Participation Quality:  Sharing  Affect:  Depressed  Cognitive:  Alert, Appropriate and Oriented  Insight:  Good  Engagement in Group:  Limited  Engagement in Therapy:  Limited  Modes of Intervention:  Clarification, Socialization and Support  Summary of Progress/Problems:  Jordan Hudson was quiet during most of group yet his one comment was powerful.  He shared that "in order to have a balanced life one needs a good foundation and structure; for example a support system."   Clide Dales 12/04/2011, 6:54 PM

## 2011-12-04 NOTE — [facility] Counselor (Signed)
Adult Comprehensive Assessment  Patient ID: Jordan Hudson, male   DOB: 1986/08/18, 26 y.o.   MRN: [id number]  Information Source:    Current Stressors:  Educational / Learning stressors: No issues Employment / Job issues: No issues Family Relationships: Recent breakup w GF and mother of his 82 year old daughter Surveyor, quantity / Lack of resources (include bankruptcy): no issues Housing / Lack of housing: no issues Physical health (include injuries & life threatening diseases): no current issues other than lack of sleep Social relationships: isolating; most of patient's friends use substances Substance abuse: history Bereavement / Loss: father in 2005  Living/Environment/Situation:  Living Arrangements: Parent Living conditions (as described by patient or guardian): supportive How long has patient lived in current situation?: 8 months following breakup with girlfriend patient decided to let girlfriend and daughter live in home they had shared What is atmosphere in current home: Supportive;Temporary  Family History:  Marital status: Single (long-term relationship ended 8 months ago) Does patient have children?: Yes How many children?: 1  How is patient's relationship with their children?: good with 39-year-old daughter although patient would like to see her on a daily basis  Childhood History:  By whom was/is the patient raised?: Both parents Additional childhood history information: thought there was retired from [employer] 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 of patient's relationship with caregiver when they were a child: difficult with father as he was very strict; closer to mother as a child Patient's description of current relationship with people who raised him/her: father deceased; "mother is my rock" Does patient have siblings?: Yes Number of Siblings: 2  Description of patient's current relationship with siblings: good with both older brother and younger  sister Did patient suffer any verbal/emotional/physical/sexual abuse as a child?: No Did patient suffer from severe childhood neglect?: No Has patient ever been sexually abused/assaulted/raped as an adolescent or adult?: No Was the patient ever a victim of a crime or a disaster?: No Witnessed domestic violence?: No Has patient been effected by domestic violence as an adult?: No  Education:  Highest grade of school patient has completed: GED Currently a Consulting civil engineer?: No Learning disability?: No (patient made good grades just had poor attendance record)  Employment/Work Situation:   Employment situation: Employed Where is patient currently employed?: [employer] How long has patient been employed?: 3 years Patient's job has been impacted by current illness: No What is the longest time patient has a held a job?: current 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 Has patient ever been in the [employer]?: Yes (Describe in comment) (2.5 years in the [employer] stationed in [place] Has patient ever served in combat?: No  Financial Resources:   Financial resources: Income from employment Does patient have a representative payee or guardian?: No  Alcohol/Substance Abuse:   What has been your use of drugs/alcohol within the last 12 months?: 3-4 40 ounce servings of malt liquor daily for 8 months; occasional use of liquor If attempted suicide, did drugs/alcohol play a role in this?:  (no attempt) Alcohol/Substance Abuse Treatment Hx: Past Tx, Inpatient If yes, describe treatment: [facility] 30 day inpatient treatment in [place] requirement  for DUI court Has alcohol/substance abuse ever caused legal problems?: Yes (2 DUIs; 7 months left of three-year probation)  Social Support System:   Patient's Community Support System: Good Describe Community Support System: mother, daughter's mother, and grandmother Type of faith/religion: Ephriam Knuckles How does patient's faith help to  cope with current illness?: N/A  Leisure/Recreation:   Leisure and Hobbies: used to workout but lost interest; plan is to attend [employer] on [address] in [place]  Strengths/Needs:   What things does the patient do well?: good person, good manners, good worker In what areas does patient struggle / problems for patient: alcohol and expressing my feelings  Discharge Plan:   Does patient have access to transportation?: Yes Will patient be returning to same living situation after discharge?: Yes Currently receiving community mental health services: Yes (From Whom) ([facility] Rosalyn G[facility] as required by probation) Does patient have financial barriers related to discharge medications?: No  Summary/Recommendations:   Summary and Recommendations (to be completed by the evaluator): Patient is 26 year old single employed [place] American male admitted with diagnosis of alcohol dependence and mood disorder NOS. Patient voluntarily seeking detox and followup treatment for alcohol dependence. Patient will benefit from crisis stabilization, medication evaluation, group therapy and psychoeducation, in addition to case management for discharge planning.  Clide Dales. 12/04/2011

## 2011-12-04 NOTE — Progress Notes (Signed)
Cosigned by Carney Bern, LCSWA 4/4/201310:04 AM

## 2011-12-05 NOTE — Progress Notes (Signed)
[facility] Case Management Discharge Plan:  Will you be returning to the same living situation after discharge: Yes,  return home At discharge, do you have transportation home?:Yes,  has transportation home Do you have the ability to pay for your medications:Yes,  access to meds  Interagency Information:     Release of information consent forms completed and in the chart;  Patient's signature needed at discharge.  Patient to Follow up at:  Follow-up Information    Follow up with [facility] on 12/08/2011. (Initial assessment at 12:00 pm with [facility] ($50 reschedule fee if 24 hours notice not given).  Groups are Monday, Tuesday, Wednesday and Friday 6-9:30 pm)    Contact information:   [address] [place], [place] [address] [phone]      Follow up with AA meetings. (See brochure for local meetings and times!)          Patient denies SI/HI:   Yes,  denies today    Safety Planning and Suicide Prevention discussed:  Yes,  discussed with pt  Barrier to discharge identified:No.  Summary and Recommendations: Pt attended discharge planning group and actively participated.  Pt presents with calm mood and affect.  Pt denies having depression, anxiety and SI.  Pt reports feeling stable to d/c.  Pt discussed his mom being supportive and finding AA meetings for him.  No recommendations from SW.  No further needs voiced by pt.  Pt stable to discharge.     Carmina Miller 12/05/2011, 10:15 AM

## 2011-12-05 NOTE — Progress Notes (Signed)
[facility] Group Notes:  (Counselor/Nursing/MHT/Case Management/Adjunct)  12/05/2011 7:14 PM  Type of Therapy:  Group Therapy  Participation Level:  Active  Participation Quality:  Appropriate, Attentive and Sharing  Affect:  Appropriate  Cognitive:  Appropriate  Insight:  Good  Engagement in Group:  Good  Engagement in Therapy:  Good  Modes of Intervention:  Socialization and Support  Summary of Progress/Problems:  Patient was attentive to short film on vulnerability and following group discussion. Seemed to identify well with what was being said by others.   Clide Dales 12/05/2011, 7:14 PM

## 2011-12-05 NOTE — Discharge Summary (Signed)
Physician Discharge Summary Note  Patient:  Jordan Hudson is an 26 y.o., male MRN:  [id number] DOB:  09/08/1985 Patient phone:  [phone] (home)  Patient address:   [address] [place] [address],   Date of Admission:  12/03/2011 Date of Discharge: 12/05/2011  Reason for Admission: Alcohol detox  Discharge Diagnoses: Active Problems:  Chronic alcohol dependence, continuous   Axis Diagnosis:   AXIS I:  Alcohol dependence, SIMDO AXIS II:  deferred AXIS III:  HTN, ED AXIS IV: problems related to his primary support group AXIS V:  GAF: 65  Level of Care:  Outpatient  Hospital Course:        Jordan Hudson was admitted for detox from alcohol and crisis management.  He was treated with the standard Librium protocol.  Medical problems were identified and treated.  Home medication was restarted as appropriate.     Improvement was monitored by CIWA/COWS scores and patient's daily report of withdrawal symptom reduction. Emotional and mental status was monitored by daily self inventory reports completed by the patient and clinical staff.      The patient was evaluated by the treatment team for stability and plans for continued recovery upon discharge. He was offered further treatment options upon discharge including Residential, IOP, and Outpatient treatment.  The patient's motivation was an integral factor for scheduling further treatment.  Employment, transportation, bed availability, health status, family support, and any pending legal issues were also considered. Jordan Hudson selected [facility] for follow up.    Upon completion of detox the patient was both mentally and medically stable for discharge.    Consults:  None  Significant Diagnostic Studies:  none  Discharge Vitals:   Blood pressure 130/82, pulse 65, temperature 96.7 F (35.9 C), temperature source Oral, resp. rate 24.  Mental Status Exam: See Mental Status Examination and Suicide Risk Assessment completed by Attending Physician  prior to discharge.  Discharge destination:  Home  Is patient on multiple antipsychotic therapies at discharge:  No   Has Patient had three or more failed trials of antipsychotic monotherapy by history:  No Recommended Plan for Multiple Antipsychotic Therapies: not applicable Discharge Orders    Future Orders Please Complete By Expires   Diet - low sodium heart healthy      Increase activity slowly      Discharge instructions      Comments:   Follow up at [facility] as planned.  90 meetings in 90 days.     Medication List  As of 12/05/2011 12:03 PM   STOP taking these medications         ibuprofen 200 MG tablet           Follow-up Information    Follow up with [facility] on 12/08/2011. (Initial assessment at 12:00 pm with Viviann Spare [facility] ($50 reschedule fee if 24 hours notice not given).  Groups are Monday, Tuesday, Wednesday and Friday 6-9:30 pm)    Contact information:   [address] [place], [place] [address] [phone]      Follow up with AA meetings. (See brochure for local meetings and times!)          Follow-up recommendations:  Continued support with personal out patient therapist.  Comments:  Jordan Hudson was well motivated, intelligent and sincere in his desire for improvement and continued sobriety, using his daughter as key motivation.  Signed: Rona Ravens. Jordan Hudson PAC For Dr. Jamesetta So Bogard 12/05/2011, 12:03 PM

## 2011-12-05 NOTE — [facility] Suicide Risk Assessment (Signed)
Suicide Risk Assessment  Discharge Assessment     Demographic factors:  Male    Current Mental Status Per Nursing Assessment::   On Admission:    At Discharge:     Current Mental Status Per Physician:  Loss Factors: Legal issues;Loss of significant relationship (broke up with girlfriend 8 months ago)  Historical Factors:    Risk Reduction Factors:      Continued Clinical Symptoms:  Depression:   Recent sense of peace/wellbeing Alcohol/Substance Abuse/Dependencies Previous Psychiatric Diagnoses and Treatments  Discharge Diagnoses:   AXIS I:  Alcohol Abuse AXIS II:  Deferred AXIS III:   Past Medical History  Diagnosis Date  . Depression     8 months   AXIS IV:  economic problems, housing problems and other psychosocial or environmental problems AXIS V:  61-70 mild symptoms  Cognitive Features That Contribute To Risk:  Thought constriction (tunnel vision)    Suicide Risk:  Minimal: No identifiable suicidal ideation.  Patients presenting with no risk factors but with morbid ruminations; may be classified as minimal risk based on the severity of the depressive symptoms  Plan Of Care/Follow-up recommendations:  Activity:  Pt will return to her mother's home.  He has plans to go to AA with her mother who is taking an active part in his attempt to maintain sobriety Other:  He is referred to a community rehab group in downtown [place]. He has an apt at 12 noon Monday Apr 7th..  He is able to acknowledge that alcohol is a disease and it takes work to avoid returning to drinking.  He expresses great joy for feeling very good on waking instead being groggy.  He has discovered he likes to color and will enjoy coloring with his daughter.   Cloa Bushong 12/05/2011, 1:11 PM

## 2011-12-05 NOTE — Progress Notes (Signed)
Pt. 3Being discharged to home via his own vechicle. Denies SI and HI. All belongings returned to Pt

## 2011-12-05 NOTE — Progress Notes (Signed)
Patient resting quietly with eyes closed. Respirations even and unlabored. No distress noted . Q 15 minute check continues as ordered to maintain safety. 

## 2011-12-05 NOTE — Tx Team (Signed)
Interdisciplinary Treatment Plan Update (Adult)  Date:  12/05/2011  Time Reviewed:  10:00 AM   Progress in Treatment: Attending groups: Yes Participating in groups:  Yes Taking medication as prescribed: Yes Tolerating medication:  Yes Family/Significant othe contact made:   Patient understands diagnosis:  Yes Discussing patient identified problems/goals with staff:  Yes Medical problems stabilized or resolved:  Yes Denies suicidal/homicidal ideation: Yes Issues/concerns per patient self-inventory:  None identified Other: N/A  New problem(s) identified: None Identified  Reason for Continuation of Hospitalization: Stable to d/c  Interventions implemented related to continuation of hospitalization: Stable to d/c  Additional comments: N/A  Estimated length of stay: D/C today  Discharge Plan: Pt will follow up at [facility] for IOP and AA meetings and a sponsor.  New goal(s): N/A  Review of initial/current patient goals per problem list:    1.  Goal(s): Address substance use  Met:  Yes  Target date: by discharge  As evidenced by: completed detox protocol and referred to appropriate treatment   Attendees: Patient:  Jordan Hudson 12/05/2011 10:14 AM   Family:     Physician:   12/05/2011 10:00 AM   Nursing:  12/05/2011 10:00 AM   Case Manager:  Reyes Ivan, LCSWA 12/05/2011  10:00 AM   Counselor:  Ronda Fairly, LCSWA 12/05/2011  10:00 AM   Other:  Tanya Nones, SW intern 12/05/2011 10:00 AM   Other:  Verne Spurr, PA 12/05/2011 10:12 AM   Other:  Ambrose Mantle, LCSW 12/05/2011 10:13 AM   Other:      Scribe for Treatment Team:   Reyes Ivan 12/05/2011 10:00 AM

## 2011-12-05 NOTE — Progress Notes (Signed)
[facility] Group Notes:  (Counselor/Nursing/MHT/Case Management/Adjunct)  12/05/2011 3:34 PM  Type of Therapy:  1:15PM Group Therapy  Participation Level:  Minimal  Participation Quality:  Appropriate and Attentive  Affect:  Appropriate  Cognitive:  Alert and Appropriate  Insight:  Limited  Engagement in Group:  Limited  Engagement in Therapy:  Limited  Modes of Intervention:  Education and Exploration  Summary of Progress/Problems: Patient was attentive to educational DVD on vulnerability. Patient seemed to relate well to peers in discussing vulnerability and relapse.   Wilmon Arms 12/05/2011, 3:34 PM  Cosigned by Carney Bern, LCSWA 4/5/20134:38 PM

## 2011-12-09 NOTE — Progress Notes (Signed)
Patient Discharge Instructions:  After Visit Summary (AVS) Provided,  12/09/2011 Face Sheet Provided, 12/09/2011 Faxed/Sent to the Next [facility] Care provider:  12/09/2011 Provided Suicide Risk Assessment - Discharge Assessment 12/09/2011  Faxed to [facility] @ [phone]  Wandra Scot, 12/09/2011, 11:04 AM

## 2012-05-02 ENCOUNTER — Emergency Department (HOSPITAL_COMMUNITY)
Admission: EM | Admit: 2012-05-02 | Discharge: 2012-05-02 | Disposition: A | Discharge status: Home / Self Care | Payer: 59 | Attending: Emergency Medicine | Admitting: Emergency Medicine

## 2012-05-02 ENCOUNTER — Encounter (HOSPITAL_COMMUNITY): Payer: Self-pay | Admitting: Emergency Medicine

## 2012-05-02 DIAGNOSIS — B86 Scabies: Secondary | ICD-10-CM | POA: Insufficient documentation

## 2012-05-02 DIAGNOSIS — F17200 Nicotine dependence, unspecified, uncomplicated: Secondary | ICD-10-CM | POA: Insufficient documentation

## 2012-05-02 DIAGNOSIS — R21 Rash and other nonspecific skin eruption: Secondary | ICD-10-CM

## 2012-05-02 DIAGNOSIS — F329 Major depressive disorder, single episode, unspecified: Secondary | ICD-10-CM | POA: Insufficient documentation

## 2012-05-02 DIAGNOSIS — F3289 Other specified depressive episodes: Secondary | ICD-10-CM | POA: Insufficient documentation

## 2012-05-02 MED ORDER — PERMETHRIN 5 % EX CREA: TOPICAL_CREAM | Freq: Once | CUTANEOUS | Status: AC

## 2012-05-02 NOTE — ED Notes (Signed)
Pt presents w/ fine rash that covers most of his body, present for month and 1/2. Has self treated w/ Calamine, Benadryl and hydrocortisone cream w/o improvement.

## 2012-05-02 NOTE — ED Notes (Signed)
[id number]> Expected date:<BR> Expected time:<BR> Means of arrival:<BR> Comments:<BR> closed

## 2012-05-02 NOTE — ED Provider Notes (Signed)
History     CSN: [id number]  Arrival date & time 05/02/12  4098   First MD Initiated Contact with Patient 05/02/12 1104      Chief Complaint  Patient presents with  . Rash    (Consider location/radiation/quality/duration/timing/severity/associated sxs/prior treatment) Patient is a 26 y.o. male presenting with rash. The history is provided by the patient and medical records.  Rash    Jordan Hudson is a 26 y.o. male presents to the emergency department complaining of total body rash.  The onset of the symptoms was gradual, starting 1 month ago.  The patient has associated pruritus.  The symptoms have been  persistent, gradually worsened.  Nothing makes the symptoms worse and nothing makes symptoms better.  The patient denies fever, chills, headache, chest pain, shortness of breath, oozing from the sites.  Patient states the rash began approximately one month ago around the wrist and ankles and progressed upward to the arms and legs and onto the chest. History of scabies when he was a child but does not know whether or not this is the same it. He's tried Caladryl, calamine and hydrocortisone without relief.    Past Medical History  Diagnosis Date  . Depression     8 months    History reviewed. No pertinent past surgical history.  No family history on file.  History  Substance Use Topics  . Smoking status: Current Everyday Smoker -- 0.5 packs/day for 10 years    Types: Cigarettes  . Smokeless tobacco: Never Used  . Alcohol Use: Yes     4-5 24oz weekly      Review of Systems  Constitutional: Negative for fever, diaphoresis, appetite change, fatigue and unexpected weight change.  HENT: Negative for mouth sores and neck stiffness.   Respiratory: Negative for shortness of breath.   Cardiovascular: Negative for chest pain.  Gastrointestinal: Negative for nausea, vomiting, abdominal pain and diarrhea.  Genitourinary: Negative for dysuria and hematuria.  Musculoskeletal:  Negative for myalgias, joint swelling and arthralgias.  Skin: Positive for rash.  Neurological: Negative for light-headedness and headaches.  Psychiatric/Behavioral: Negative for disturbed wake/sleep cycle. The patient is not nervous/anxious.     Allergies  Review of patient's allergies indicates no known allergies.  Home Medications   Current Outpatient Rx  Name Route Sig Dispense Refill  . HYDROCORTISONE 0.5 % EX CREA Topical Apply 1 application topically 2 (two) times daily.    Marland Kitchen PERMETHRIN 5 % EX CREA Topical Apply topically once. Apply to entire body, wash off after 8-14 hours then repeat in one week. 60 g 1    BP 131/72  Pulse 73  Temp 98.5 F (36.9 C) (Oral)  SpO2 99%  Physical Exam  Nursing note and vitals reviewed. Constitutional: He appears well-developed and well-nourished. No distress.  HENT:  Head: Normocephalic and atraumatic.  Mouth/Throat: Oropharynx is clear and moist. No oropharyngeal exudate.  Eyes: Conjunctivae are normal. No scleral icterus.  Neck: Normal range of motion. Neck supple.  Cardiovascular: Normal rate, regular rhythm, normal heart sounds and intact distal pulses.  Exam reveals no gallop and no friction rub.   No murmur heard. Pulmonary/Chest: Effort normal and breath sounds normal. No respiratory distress. He has no wheezes.  Musculoskeletal: Normal range of motion. He exhibits no edema.  Neurological: He is alert.       Speech is clear and goal oriented Moves extremities without ataxia  Skin: Skin is warm and dry. Rash (papular rash covering the extremities and trunk. Worse in  the groin and around the wrists and ankles. None seen in the webs of the fingers or toes. Not painful to palpation.) noted. He is not diaphoretic.  Psychiatric: He has a normal mood and affect.    ED Course  Procedures (including critical care time)  Labs Reviewed - No data to display No results found.   1. Rash   2. Scabies       MDM  Brooke Dare  presents for one-month of rash.  The not classic the rash appears to be scabies in nature.  We'll treat with permethrin. About the patient to followup in the emergency department if the rash does not resolve after the second application.  I have also discussed reasons to return immediately to the ER.  Patient expresses understanding and agrees with plan.   1. Medications: Permethrin cream 5% 2. Treatment: Follow directions on permethrin cream, advise people your living with to be treated as well 3. Follow Up: Primary care physician or emergency department if symptoms worsen        Dierdre Forth, PA-C 05/02/12 1145

## 2012-05-03 NOTE — ED Provider Notes (Signed)
Medical screening examination/treatment/procedure(s) were conducted as a shared visit with non-physician practitioner(s) and myself.  I personally evaluated the patient during the encounter.  The patient presents with rash for the past month.  It is located on both arms and axillae and is itchy.  On exam, the vitals are stable and he is afebrile.  There is a raised, pruritic rash on both arms and axillae.  This appears to be scabies.  Will treat with permethrin cream.  Follow prn if not improving.  Geoffery Lyons, MD 05/03/12 [phone]

## 2013-06-07 ENCOUNTER — Emergency Department (HOSPITAL_COMMUNITY)
Admission: EM | Admit: 2013-06-07 | Discharge: 2013-06-07 | Disposition: A | Discharge status: Home / Self Care | Payer: 59 | Attending: Emergency Medicine | Admitting: Emergency Medicine

## 2013-06-07 ENCOUNTER — Encounter (HOSPITAL_COMMUNITY): Payer: Self-pay | Admitting: Nurse Practitioner

## 2013-06-07 DIAGNOSIS — Z8659 Personal history of other mental and behavioral disorders: Secondary | ICD-10-CM | POA: Insufficient documentation

## 2013-06-07 DIAGNOSIS — N489 Disorder of penis, unspecified: Secondary | ICD-10-CM | POA: Insufficient documentation

## 2013-06-07 DIAGNOSIS — R300 Dysuria: Secondary | ICD-10-CM | POA: Insufficient documentation

## 2013-06-07 DIAGNOSIS — Z202 Contact with and (suspected) exposure to infections with a predominantly sexual mode of transmission: Secondary | ICD-10-CM | POA: Insufficient documentation

## 2013-06-07 DIAGNOSIS — F17200 Nicotine dependence, unspecified, uncomplicated: Secondary | ICD-10-CM | POA: Insufficient documentation

## 2013-06-07 DIAGNOSIS — Z711 Person with feared health complaint in whom no diagnosis is made: Secondary | ICD-10-CM

## 2013-06-07 MED ORDER — LIDOCAINE HCL (PF) 1 % IJ SOLN
2.0000 mL | Freq: Once | INTRAMUSCULAR | Status: AC
  Administered 2013-06-07: 2 mL
  Filled 2013-06-07: qty 5

## 2013-06-07 MED ORDER — CEFTRIAXONE SODIUM 250 MG IJ SOLR
250.0000 mg | Freq: Once | INTRAMUSCULAR | Status: AC
  Administered 2013-06-07: 250 mg via INTRAMUSCULAR
  Filled 2013-06-07: qty 250

## 2013-06-07 MED ORDER — AZITHROMYCIN 250 MG PO TABS
1000.0000 mg | ORAL_TABLET | Freq: Once | ORAL | Status: AC
  Administered 2013-06-07: 1000 mg via ORAL
  Filled 2013-06-07: qty 4

## 2013-06-07 NOTE — ED Notes (Signed)
C/o penile itchiing and burning x 2 weeks.

## 2013-06-07 NOTE — ED Provider Notes (Signed)
CSN: [id number]     Arrival date & time 06/07/13  1656 History  This chart was scribed for non-physician practitioner Jordan Pel, PA-C working with Jordan Hudson. Jordan Payor, MD by Jordan Hudson, ED Scribe. This patient was seen in room TR06C/TR06C and the patient's care was started at 5:21 PM.   Chief Complaint  Patient presents with  . SEXUALLY TRANSMITTED DISEASE   The history is provided by the patient. No language interpreter was used.   HPI Comments: Jordan Hudson is a 27 y.o. male who presents to the Emergency Department complaining of intermittent penile burning and dysuria that started two weeks ago. He has had multiple male sexual partners recently and has not been using protection every time. He denies rash or itching to the area. He denies penile discharge, fevers, nausea, emesis.   Past Medical History  Diagnosis Date  . Depression     8 months   History reviewed. No pertinent past surgical history. History reviewed. No pertinent family history. History  Substance Use Topics  . Smoking status: Current Every Day Smoker -- 0.50 packs/day for 10 years    Types: Cigarettes  . Smokeless tobacco: Never Used  . Alcohol Use: Yes     Comment: 4-5 24oz weekly    Review of Systems  All other systems reviewed and are negative.   ROS: No TIA's or unusual headaches, no dysphagia.  No prolonged cough. No dyspnea or chest pain on exertion.  No abdominal pain, change in bowel habits, black or bloody stools.  No urinary tract symptoms.  No new or unusual musculoskeletal symptoms.  Normal menses, no abnormal vaginal bleeding, discharge or unexpected pelvic pain. No new breast lumps, breast pain or nipple discharge.  Allergies  Review of patient's allergies indicates no known allergies.  Home Medications   No current outpatient prescriptions on file. BP 159/79  Pulse 74  Temp(Src) 98.6 F (37 C) (Oral)  Resp 18  Ht 5\' 8"  (1.727 m)  Wt 169 lb (76.658 kg)  BMI 25.7 kg/m2   SpO2 98% Physical Exam  Nursing note and vitals reviewed. Constitutional: He appears well-developed and well-nourished. No distress.  HENT:  Head: Normocephalic and atraumatic.  Eyes: Pupils are equal, round, and reactive to light.  Neck: Normal range of motion. Neck supple.  Cardiovascular: Normal rate and regular rhythm.   Pulmonary/Chest: Effort normal.  Abdominal: Soft.  Genitourinary: Testes normal. Circumcised. No penile tenderness. No discharge found.  Neurological: He is alert.  Skin: Skin is warm and dry.    ED Course  Procedures (including critical care time) Medications  cefTRIAXone (ROCEPHIN) injection 250 mg (not administered)  azithromycin (ZITHROMAX) tablet 1,000 mg (not administered)    DIAGNOSTIC STUDIES: Oxygen Saturation is 98% on RA, normal by my interpretation.    COORDINATION OF CARE: 5:51 PM- Discussed treatment plan with pt which includes a rocephin injection and treatment with antibiotics and pt agrees to plan. Will be called in 2-3 days if GC positive    Labs Review Labs Reviewed  GC/CHLAMYDIA PROBE AMP   Imaging Review No results found.  MDM   1. Concern about STD in male without diagnosis    27 y.o.Jordan Hudson's evaluation in the Emergency Department is complete. It has been determined that no acute conditions requiring further emergency intervention are present at this time. The patient/guardian have been advised of the diagnosis and plan. We have discussed signs and symptoms that warrant return to the ED, such as changes or worsening in symptoms.  Vital signs are stable at discharge. Filed Vitals:   06/07/13 1710  BP: 159/79  Pulse: 74  Temp: 98.6 F (37 C)  Resp: 18    Patient/guardian has voiced understanding and agreed to follow-up with the PCP or specialist.  I personally performed the services described in this documentation, which was scribed in my presence. The recorded information has been reviewed and is  accurate.    Jordan Matas, PA-C 06/07/13 1758

## 2013-06-08 LAB — GC/CHLAMYDIA PROBE AMP: CT Probe RNA: NEGATIVE

## 2013-06-10 NOTE — ED Provider Notes (Signed)
Medical screening examination/treatment/procedure(s) were performed by non-physician practitioner and as supervising physician I was immediately available for consultation/collaboration.  Mikell Kazlauskas R. Deandra Gadson, MD 06/10/13 1016 

## 2013-12-12 ENCOUNTER — Emergency Department (HOSPITAL_COMMUNITY): Payer: 59

## 2013-12-12 ENCOUNTER — Emergency Department (HOSPITAL_COMMUNITY)
Admission: EM | Admit: 2013-12-12 | Discharge: 2013-12-12 | Disposition: A | Discharge status: Home / Self Care | Payer: 59 | Attending: Emergency Medicine | Admitting: Emergency Medicine

## 2013-12-12 ENCOUNTER — Encounter (HOSPITAL_COMMUNITY): Payer: Self-pay | Admitting: Emergency Medicine

## 2013-12-12 DIAGNOSIS — Z8659 Personal history of other mental and behavioral disorders: Secondary | ICD-10-CM | POA: Insufficient documentation

## 2013-12-12 DIAGNOSIS — F17200 Nicotine dependence, unspecified, uncomplicated: Secondary | ICD-10-CM | POA: Insufficient documentation

## 2013-12-12 DIAGNOSIS — S0990XA Unspecified injury of head, initial encounter: Secondary | ICD-10-CM | POA: Insufficient documentation

## 2013-12-12 DIAGNOSIS — S4980XA Other specified injuries of shoulder and upper arm, unspecified arm, initial encounter: Secondary | ICD-10-CM | POA: Insufficient documentation

## 2013-12-12 DIAGNOSIS — R404 Transient alteration of awareness: Secondary | ICD-10-CM | POA: Insufficient documentation

## 2013-12-12 DIAGNOSIS — S022XXA Fracture of nasal bones, initial encounter for closed fracture: Secondary | ICD-10-CM | POA: Insufficient documentation

## 2013-12-12 DIAGNOSIS — S0510XA Contusion of eyeball and orbital tissues, unspecified eye, initial encounter: Secondary | ICD-10-CM | POA: Insufficient documentation

## 2013-12-12 DIAGNOSIS — IMO0002 Reserved for concepts with insufficient information to code with codable children: Secondary | ICD-10-CM

## 2013-12-12 DIAGNOSIS — S46909A Unspecified injury of unspecified muscle, fascia and tendon at shoulder and upper arm level, unspecified arm, initial encounter: Secondary | ICD-10-CM | POA: Insufficient documentation

## 2013-12-12 MED ORDER — HYDROCODONE-ACETAMINOPHEN 5-325 MG PO TABS: 1.0000 | ORAL_TABLET | ORAL | Status: DC | PRN

## 2013-12-12 NOTE — Discharge Instructions (Signed)
Take the prescribed medication as directed.  May continue icing nose to help with pain and swelling. Follow-up with Dr. Jearld FentonByers regarding nasal fractures. May follow up with one of the behavioral health clinics for help with your depression-- call and schedule appt. Return to the ED for new or worsening symptoms.

## 2013-12-12 NOTE — ED Provider Notes (Signed)
Medical screening examination/treatment/procedure(s) were performed by non-physician practitioner and as supervising physician I was immediately available for consultation/collaboration.   EKG Interpretation None       Jordan Delange R. Katelan Hirt, MD 12/12/13 2356 
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
## 2014-02-18 ENCOUNTER — Encounter (HOSPITAL_COMMUNITY): Payer: Self-pay | Admitting: Emergency Medicine

## 2014-02-18 ENCOUNTER — Emergency Department (HOSPITAL_COMMUNITY)
Admission: EM | Admit: 2014-02-18 | Discharge: 2014-02-19 | Disposition: A | Discharge status: Home / Self Care | Payer: 59 | Attending: Emergency Medicine | Admitting: Emergency Medicine

## 2014-02-18 DIAGNOSIS — K089 Disorder of teeth and supporting structures, unspecified: Secondary | ICD-10-CM | POA: Insufficient documentation

## 2014-02-18 DIAGNOSIS — K0889 Other specified disorders of teeth and supporting structures: Secondary | ICD-10-CM

## 2014-02-18 DIAGNOSIS — F17200 Nicotine dependence, unspecified, uncomplicated: Secondary | ICD-10-CM | POA: Insufficient documentation

## 2014-02-18 DIAGNOSIS — Z8659 Personal history of other mental and behavioral disorders: Secondary | ICD-10-CM | POA: Insufficient documentation

## 2014-02-18 DIAGNOSIS — K029 Dental caries, unspecified: Secondary | ICD-10-CM | POA: Insufficient documentation

## 2014-02-18 MED ORDER — OXYCODONE-ACETAMINOPHEN 5-325 MG PO TABS
1.0000 | ORAL_TABLET | Freq: Once | ORAL | Status: AC
  Administered 2014-02-18: 1 via ORAL
  Filled 2014-02-18: qty 1

## 2014-02-18 NOTE — ED Notes (Signed)
The pt has had dental pain since yesterday.  Swelling on his gums since then
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
## 2014-02-19 MED ORDER — PENICILLIN V POTASSIUM 500 MG PO TABS
500.0000 mg | ORAL_TABLET | Freq: Four times a day (QID) | ORAL | Status: DC
Start: 2014-02-19 — End: 2014-02-22

## 2014-02-19 MED ORDER — TRAMADOL HCL 50 MG PO TABS: 50.0000 mg | ORAL_TABLET | Freq: Four times a day (QID) | ORAL | Status: DC | PRN

## 2014-02-19 NOTE — ED Notes (Signed)
Pt ambulating independently w/ steady gait on d/c in no acute distress, A&Ox4. D/c instructions reviewed w/ pt - pt denies any further questions or concerns at present. Rx given x2  

## 2014-02-19 NOTE — ED Provider Notes (Signed)
Medical screening examination/treatment/procedure(s) were performed by non-physician practitioner and as supervising physician I was immediately available for consultation/collaboration.   EKG Interpretation None        Stephen Rancour, MD 02/19/14 0138 

## 2014-02-19 NOTE — Discharge Instructions (Signed)
You have a dental injury. Use the resource guide listed below to help you find a dentist if you do not already have one to followup with. It is very important that you get evaluated by a dentist as soon as possible. Call tomorrow to schedule an appointment. Use your pain medication as prescribed and do not operate heavy machinery while on pain medication. Take your full course of antibiotics. Read the instructions below. ° °Eat a soft or liquid diet and rinse your mouth out after meals with warm water. You should see a dentist or return here at once if you have increased swelling, increased pain or uncontrolled bleeding from the site of your injury. ° ° °SEEK MEDICAL CARE IF:  °· You have increased pain not controlled with medicines.  °· You have swelling around your tooth, in your face or neck.  °· You have bleeding which starts, continues, or gets worse.  °· You have a fever >101 °· If you are unable to open your mouth ° °RESOURCE GUIDE ° °Dental Problems ° °Patients with Medicaid: °[facility]                     [facility] °[address]                                           [address] °Phone:  [phone]                                                  Phone:  [phone] ° °If unable to pay or uninsured, contact:  [facility] or [facility] to become qualified for the adult dental clinic. ° °Chronic Pain Problems °Contact [facility]  [phone] °Patients need to be referred by their primary care doctor. ° °Insufficient Money for Medicine °Contact [employer]:  call "211" or [facility] 271-5999. ° °No Primary Care Doctor °Call [facility]  832-8000 °Other agencies that provide inexpensive medical care °   [facility]  [phone] °   [facility]  [phone] °   [facility]  [phone] °   [facility]  832-4777 °   [facility]  [phone] °   [facility]   [phone] ° °Psychological Services °[facility]  [phone] °[facility]  [phone] °[facility]   [phone] (emergency services [phone]) ° °Substance Abuse Resources °[facility]  336-882-2125 °[facility] 336-784-9470 °[facility] 336-285-9073 °[facility] 336-845-3988 °Residential & Outpatient Substance Abuse Program  800-659-3381 ° °Abuse/Neglect °[place] [facility] (336) 641-3795 °[place] [facility] 800-378-5315 (After Hours) ° °Emergency Shelter °[facility] (336) 271-5985 ° °Maternity Homes °[facility] (336) 275-9566 °[facility] (704) 372-4663 ° °MRSA Hotline #:   832-7006 ° ° ° °[place] [facility] ° °[facility] [place] [facility]     [employer]                          [facility] °[address] [place]                         [address]      [address]  °[place]                                                [place]                            [place] °Phone:  349-3220                                   Phone:  [phone]                 Phone:  342-8140 ° °[facility] °Phone:  342-8316 ° °[place] [facility] Hotline °(336) 342-1394 °(336) 342-3537 (After Hours) ° ° ° ° °

## 2014-02-22 ENCOUNTER — Encounter (HOSPITAL_COMMUNITY): Payer: Self-pay | Admitting: Emergency Medicine

## 2014-02-22 ENCOUNTER — Emergency Department (HOSPITAL_COMMUNITY)
Admission: EM | Admit: 2014-02-22 | Discharge: 2014-02-22 | Discharge status: Against Medical Advice | Payer: 59 | Attending: Emergency Medicine | Admitting: Emergency Medicine

## 2014-02-22 DIAGNOSIS — K1370 Unspecified lesions of oral mucosa: Secondary | ICD-10-CM | POA: Insufficient documentation

## 2014-02-22 DIAGNOSIS — F17200 Nicotine dependence, unspecified, uncomplicated: Secondary | ICD-10-CM | POA: Insufficient documentation

## 2014-02-22 NOTE — ED Notes (Signed)
Patient decided to leave.  Stated his bleeding stopped.  Encouraged. To return if the bleeding started again
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
## 2014-06-01 ENCOUNTER — Emergency Department (HOSPITAL_COMMUNITY)
Admission: EM | Admit: 2014-06-01 | Discharge: 2014-06-02 | Disposition: A | Discharge status: Other Acute Inpatient Hospital | Payer: 59 | Attending: Emergency Medicine | Admitting: Emergency Medicine

## 2014-06-01 ENCOUNTER — Encounter (HOSPITAL_COMMUNITY): Payer: Self-pay | Admitting: Emergency Medicine

## 2014-06-01 DIAGNOSIS — F1010 Alcohol abuse, uncomplicated: Secondary | ICD-10-CM

## 2014-06-01 DIAGNOSIS — F10920 Alcohol use, unspecified with intoxication, uncomplicated: Secondary | ICD-10-CM

## 2014-06-01 DIAGNOSIS — F10230 Alcohol dependence with withdrawal, uncomplicated: Secondary | ICD-10-CM | POA: Diagnosis present

## 2014-06-01 DIAGNOSIS — F1210 Cannabis abuse, uncomplicated: Secondary | ICD-10-CM | POA: Insufficient documentation

## 2014-06-01 DIAGNOSIS — Z8659 Personal history of other mental and behavioral disorders: Secondary | ICD-10-CM | POA: Insufficient documentation

## 2014-06-01 DIAGNOSIS — I10 Essential (primary) hypertension: Secondary | ICD-10-CM | POA: Insufficient documentation

## 2014-06-01 DIAGNOSIS — F10129 Alcohol abuse with intoxication, unspecified: Secondary | ICD-10-CM | POA: Insufficient documentation

## 2014-06-01 DIAGNOSIS — Z791 Long term (current) use of non-steroidal anti-inflammatories (NSAID): Secondary | ICD-10-CM | POA: Insufficient documentation

## 2014-06-01 DIAGNOSIS — Z720 Tobacco use: Secondary | ICD-10-CM | POA: Insufficient documentation

## 2014-06-01 DIAGNOSIS — F1994 Other psychoactive substance use, unspecified with psychoactive substance-induced mood disorder: Secondary | ICD-10-CM

## 2014-06-01 DIAGNOSIS — Z792 Long term (current) use of antibiotics: Secondary | ICD-10-CM | POA: Insufficient documentation

## 2014-06-01 HISTORY — DX: Essential (primary) hypertension: I10

## 2014-06-01 LAB — COMPREHENSIVE METABOLIC PANEL
ALK PHOS: 65 U/L (ref 39–117)
ALT: 88 U/L — AB (ref 0–53)
AST: 145 U/L — AB (ref 0–37)
Albumin: 5.4 g/dL — ABNORMAL HIGH (ref 3.5–5.2)
Anion gap: 18 — ABNORMAL HIGH (ref 5–15)
BUN: 4 mg/dL — ABNORMAL LOW (ref 6–23)
CALCIUM: 9.8 mg/dL (ref 8.4–10.5)
CO2: 28 mEq/L (ref 19–32)
Chloride: 97 mEq/L (ref 96–112)
Creatinine, Ser: 0.8 mg/dL (ref 0.50–1.35)
GFR calc Af Amer: >90 mL/min (ref >90)
GFR calc non Af Amer: >90 mL/min (ref >90)
Glucose, Bld: 81 mg/dL (ref 70–99)
POTASSIUM: 4.1 meq/L (ref 3.7–5.3)
Sodium: 143 mEq/L (ref 137–147)
TOTAL PROTEIN: 9.3 g/dL — AB (ref 6.0–8.3)
Total Bilirubin: 0.2 mg/dL — ABNORMAL LOW (ref 0.3–1.2)

## 2014-06-01 LAB — CBC
HCT: 42.7 % (ref 39.0–52.0)
Hemoglobin: 14.9 g/dL (ref 13.0–17.0)
MCH: 33 pg (ref 26.0–34.0)
MCHC: 34.9 g/dL (ref 30.0–36.0)
MCV: 94.7 fL (ref 78.0–100.0)
PLATELETS: 235 10*3/uL (ref 150–400)
RBC: 4.51 MIL/uL (ref 4.22–5.81)
RDW: 14.9 % (ref 11.5–15.5)
WBC: 5.2 10*3/uL (ref 4.0–10.5)

## 2014-06-01 LAB — RAPID URINE DRUG SCREEN, HOSP PERFORMED
AMPHETAMINES: NOT DETECTED
Barbiturates: NOT DETECTED
Benzodiazepines: NOT DETECTED
Cocaine: NOT DETECTED
OPIATES: NOT DETECTED
TETRAHYDROCANNABINOL: POSITIVE — AB

## 2014-06-01 LAB — ACETAMINOPHEN LEVEL

## 2014-06-01 LAB — ETHANOL: Alcohol, Ethyl (B): 346 mg/dL — ABNORMAL HIGH (ref 0–11)

## 2014-06-01 LAB — SALICYLATE LEVEL

## 2014-06-01 MED ORDER — LORAZEPAM 1 MG PO TABS
0.0000 mg | ORAL_TABLET | Freq: Four times a day (QID) | ORAL | Status: DC
  Administered 2014-06-02: 2 mg via ORAL
  Filled 2014-06-01: qty 2

## 2014-06-01 MED ORDER — LORAZEPAM 1 MG PO TABS: 0.0000 mg | ORAL_TABLET | Freq: Two times a day (BID) | ORAL | Status: DC

## 2014-06-01 NOTE — BH Assessment (Signed)
Tele Assessment Note   Jordan Hudson is a 28 y.o. male who voluntarily presents to [facility] for alcohol detox.  Pt denies SI/HI/AVH.  Pt says he is stressed because: (1) he lost his job l month ago because of his drinking; (2) financial problems: (3) issues with his child's mother; (4) feels deserted by family/friends since losing his job.    Pt says his alcohol consumption increased after his father died several yrs ago.  Pt denies any current w/d sxs.  Pt has legal charges: assault and driving w/o a license and court dates are 06/06/14 and 06/22/14.   Pt reports drinking 80oz of beer, daily.  His last drink was 06/01/14, he drank 1-24oz beer.  Pt also smokes marijuana wkly, his last use was 2 days ago.  Pt denies w/d sxs.  Pt is tearful during interview    Axis I: Depressive Disorder NOS and Alcohol Use D/O, severe; Cannabis Use D/O, mild  Axis II: Deferred Axis III:  Past Medical History  Diagnosis Date  . Depression     8 months  . Hypertension    Axis IV: economic problems, occupational problems, other psychosocial or environmental problems, problems related to legal system/crime, problems related to social environment and problems with primary support group Axis V: 51-60 moderate symptoms  Past Medical History:  Past Medical History  Diagnosis Date  . Depression     8 months  . Hypertension     History reviewed. No pertinent past surgical history.  Family History:  Family History  Problem Relation Age of Onset  . Hypertension Mother   . Heart failure Father   . Diabetes Father   . Hypertension Brother     Social History:  reports that he has been smoking Cigarettes.  He has a 5 pack-year smoking history. He has never used smokeless tobacco. He reports that he drinks alcohol. He reports that he uses illicit drugs (Marijuana).  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;Legal;Financial Withdrawal Symptoms: Other (Comment) (No w/d sxs ) Substance #1 Name of Substance 1: Alcohol  1 - Age of First Use: 13 YOM  1 - Amount (size/oz): 80oz Beer  1 - Frequency: Daily  1 - Duration: On-going  1 - Last Use / Amount: 06/01/14 Substance #2 Name of Substance 2: THC  2 - Age of First Use: 13 YOM  2 - Amount (size/oz): 1 Blunt  2 - Frequency: Wkly  2 - Duration: On-going  2 - Last Use / Amount: 2 Days Ago   CIWA: CIWA-Ar BP: 161/101 mmHg Pulse Rate: 90 COWS:    PATIENT STRENGTHS: (choose at least two) Communication skills Work skills  Allergies: No Known Allergies  Home Medications:  (Not in a hospital admission)  OB/GYN Status:  No LMP for male patient.  General Assessment Data Location of Assessment: [facility] ED Is this a Tele or Face-to-Face Assessment?: Face-to-Face Is this an Initial Assessment or a Re-assessment for this encounter?: Initial Assessment Living Arrangements: Alone Can pt return to current living arrangement?: Yes Admission Status: Voluntary Is patient capable of signing voluntary admission?: Yes Transfer from: Home Referral Source: Self/Family/Friend  Medical Screening Exam [facility] Walk-in ONLY) Medical Exam completed: No Reason for MSE not completed: Other: (None )  [facility] Crisis Care Plan  Living Arrangements: Alone Name of Psychiatrist: None  Name of Therapist: None   Education Status Is patient currently in school?: No Current Grade: None  Highest grade of school patient has completed: None  Name of school: None  Contact person: None   Risk to self with the past 6 months Suicidal Ideation: No Suicidal Intent: No Is patient at risk for suicide?: No Suicidal Plan?: No Access to Means: No What has been your use of drugs/alcohol within the last 12 months?: Abusing: alcohol, thc   Previous Attempts/Gestures: No How many times?: 0 Other Self Harm Risks: None  Triggers for Past Attempts: None known Intentional Self Injurious Behavior: None Family Suicide History: No Recent stressful life event(s): Job Loss;Financial Problems;Conflict (Comment) (Issues with child's mother ) Persecutory voices/beliefs?: No Depression: Yes Depression Symptoms: Tearfulness;Loss of interest in usual pleasures Substance abuse history and/or treatment for substance abuse?: Yes Suicide prevention information given to non-admitted patients: Not applicable  Risk to Others within the past 6 months Homicidal Ideation: No Thoughts of Harm to Others: No Current Homicidal Intent: No Current Homicidal Plan: No Access to Homicidal Means: No Identified Victim: None  History of harm to others?: No Assessment of Violence: None Noted Violent Behavior Description: None  Does patient have access to weapons?: No Criminal Charges Pending?: Yes Describe Pending Criminal Charges: Assault; Driving w/o license  Does patient have a court date: Yes Court Date: 06/06/14 (06/22/14)  Psychosis Hallucinations: None noted Delusions: None noted  Mental Status Report Appear/Hygiene: Disheveled Eye Contact: Good Motor Activity: Unremarkable Speech: Logical/coherent;Slow Level of Consciousness: Alert Mood: Depressed;Sad Affect: Depressed;Sad Anxiety Level: None Thought Processes: Coherent;Relevant Judgement: Unimpaired Orientation: Person;Place;Time;Situation Obsessive Compulsive Thoughts/Behaviors: None  Cognitive Functioning Concentration: Normal Memory: Recent Intact;Remote Intact IQ: Average Insight: Fair Impulse Control: Fair Appetite: Fair Weight Loss: 0 Weight Gain: 0 Sleep: No Change Total Hours of Sleep: 5 Vegetative Symptoms: None  ADLScreening [facility] Assessment Services) Patient's cognitive ability adequate to safely complete daily activities?: Yes Patient able to express need  for assistance with ADLs?: Yes Independently performs ADLs?: Yes (appropriate for developmental age)  Prior Inpatient Therapy Prior Inpatient Therapy: Yes Prior Therapy Dates: 2010,2013,2014 Prior Therapy Facilty/Provider(s): [facility], [facility], [facility]  Reason for Treatment: Detox/Rehab   Prior Outpatient Therapy Prior Outpatient Therapy: No Prior Therapy Dates: None  Prior Therapy Facilty/Provider(s): None  Reason for Treatment: None   ADL Screening (condition at time of admission) Patient's cognitive ability adequate to safely complete daily activities?: Yes Is the patient deaf or have difficulty hearing?: No Does the patient have difficulty seeing, even when wearing glasses/contacts?: No Does the patient have difficulty concentrating, remembering, or making decisions?: No Patient able to express need for assistance with ADLs?: Yes Does the patient have difficulty dressing or bathing?: No Independently performs ADLs?: Yes (appropriate for developmental age) Does the patient have difficulty walking or climbing stairs?: No Weakness of Legs: None Weakness of Arms/Hands: None  Home Assistive Devices/Equipment Home Assistive Devices/Equipment: None  Therapy Consults (therapy consults require a physician order) PT Evaluation Needed: No OT Evalulation Needed: No SLP Evaluation Needed: No Abuse/Neglect Assessment (Assessment to be complete while patient is alone) Physical Abuse: Denies Verbal Abuse: Denies Sexual Abuse: Denies Exploitation of patient/patient's resources: Denies Self-Neglect: Denies Values / Beliefs Cultural Requests During Hospitalization: None Spiritual Requests During Hospitalization: None Consults Spiritual Care Consult Needed: No 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

## 2014-06-01 NOTE — ED Notes (Signed)
Pt has shorts, shirt, shoes, cellphone, lighter, hat and NCID.  Has been seen and wanded by security.

## 2014-06-01 NOTE — ED Notes (Signed)
Pt reports he wants detox from ETOH. Last drink was a 24oz beer at 1400. Normally drinks 80oz beer daily. Also uses marijuana. No SI/HI.

## 2014-06-01 NOTE — ED Provider Notes (Signed)
CSN: [id number]     Arrival date & time 06/01/14  1802 History   First MD Initiated Contact with Patient 06/01/14 1816     Chief Complaint  Patient presents with  . Medical Clearance     (Consider location/radiation/quality/duration/timing/severity/associated sxs/prior Treatment) The history is provided by the patient.  pt w hx etoh abuse, c/o wanting rehab/detox program. States has been drinking heavily, daily, for several months.  States at times drinking makes him feel depressed. Denies SI.  When stops drinking feels shaky, however denies hx dts or seizures. Denies other substance abuse problems. States physical health at baseline, no recent illness. No trauma or injury.  No fever or chills.       Past Medical History  Diagnosis Date  . Depression     8 months  . Hypertension    History reviewed. No pertinent past surgical history. Family History  Problem Relation Age of Onset  . Hypertension Mother   . Heart failure Father   . Diabetes Father   . Hypertension Brother    History  Substance Use Topics  . Smoking status: Current Every Day Smoker -- 0.50 packs/day for 10 years    Types: Cigarettes  . Smokeless tobacco: Never Used  . Alcohol Use: Yes     Comment: 2-3 beers on the weekend    Review of Systems  Constitutional: Negative for fever.  HENT: Negative for sore throat.   Eyes: Negative for visual disturbance.  Respiratory: Negative for shortness of breath.   Cardiovascular: Negative for chest pain.  Gastrointestinal: Negative for vomiting, abdominal pain and diarrhea.  Endocrine: Negative for polyuria.  Genitourinary: Negative for flank pain.  Musculoskeletal: Negative for back pain and neck pain.  Skin: Negative for rash.  Neurological: Negative for headaches.  Hematological: Does not bruise/bleed easily.  Psychiatric/Behavioral: Negative for confusion.      Allergies  Review of patient's allergies indicates no known allergies.  Home Medications    Prior to Admission medications   Medication Sig Start Date End Date Taking? Authorizing Provider  acetaminophen-codeine (TYLENOL #3) 300-30 MG per tablet Take 1 tablet by mouth every 4 (four) hours as needed for moderate pain.    Historical Provider, MD  amoxicillin (AMOXIL) 500 MG capsule Take 500 mg by mouth 3 (three) times daily. Started on 02-21-14 for 7 days    Historical Provider, MD  HYDROcodone-acetaminophen (NORCO/VICODIN) 5-325 MG per tablet Take 1 tablet by mouth every 4 (four) hours as needed. 12/12/13   Garlon Hatchet, PA-C  ibuprofen (ADVIL,MOTRIN) 200 MG tablet Take 200 mg by mouth 2 (two) times daily as needed for moderate pain.    Historical Provider, MD   BP 161/101  Pulse 90  Temp(Src) 98.3 F (36.8 C) (Oral)  Resp 18  SpO2 98% Physical Exam  Nursing note and vitals reviewed. Constitutional: He is oriented to person, place, and time. He appears well-developed and well-nourished. No distress.  HENT:  Head: Atraumatic.  Eyes: Conjunctivae are normal. Pupils are equal, round, and reactive to light. No scleral icterus.  Neck: Normal range of motion. Neck supple. No tracheal deviation present.  Cardiovascular: Normal rate, regular rhythm, normal heart sounds and intact distal pulses.   Pulmonary/Chest: Effort normal and breath sounds normal. No accessory muscle usage. No respiratory distress. He exhibits no tenderness.  Abdominal: Soft. He exhibits no distension. There is no tenderness.  Musculoskeletal: Normal range of motion. He exhibits no edema and no tenderness.  Neurological: He is alert and oriented to person,  place, and time.  Steady gait.    Skin: Skin is warm and dry. No rash noted. He is not diaphoretic.  Psychiatric: He has a normal mood and affect.    ED Course  Procedures (including critical care time) Labs Review  Results for orders placed during the hospital encounter of 06/01/14  ACETAMINOPHEN LEVEL      Result Value Ref Range   Acetaminophen  (Tylenol), Serum <15.0  10 - 30 ug/mL  CBC      Result Value Ref Range   WBC 5.2  4.0 - 10.5 K/uL   RBC 4.51  4.22 - 5.81 MIL/uL   Hemoglobin 14.9  13.0 - 17.0 g/dL   HCT 40.942.7  81.139.0 - 91.452.0 %   MCV 94.7  78.0 - 100.0 fL   MCH 33.0  26.0 - 34.0 pg   MCHC 34.9  30.0 - 36.0 g/dL   RDW 78.214.9  95.611.5 - 21.315.5 %   Platelets 235  150 - 400 K/uL  COMPREHENSIVE METABOLIC PANEL      Result Value Ref Range   Sodium 143  137 - 147 mEq/L   Potassium 4.1  3.7 - 5.3 mEq/L   Chloride 97  96 - 112 mEq/L   CO2 28  19 - 32 mEq/L   Glucose, Bld 81  70 - 99 mg/dL   BUN 4 (*) 6 - 23 mg/dL   Creatinine, Ser 0.860.80  0.50 - 1.35 mg/dL   Calcium 9.8  8.4 - 57.810.5 mg/dL   Total Protein 9.3 (*) 6.0 - 8.3 g/dL   Albumin 5.4 (*) 3.5 - 5.2 g/dL   AST 469145 (*) 0 - 37 U/L   ALT 88 (*) 0 - 53 U/L   Alkaline Phosphatase 65  39 - 117 U/L   Total Bilirubin 0.2 (*) 0.3 - 1.2 mg/dL   GFR calc non Af Amer >90  >90 mL/min   GFR calc Af Amer >90  >90 mL/min   Anion gap 18 (*) 5 - 15  ETHANOL      Result Value Ref Range   Alcohol, Ethyl (B) 346 (*) 0 - 11 mg/dL  SALICYLATE LEVEL      Result Value Ref Range   Salicylate Lvl <2.0 (*) 2.8 - 20.0 mg/dL  URINE RAPID DRUG SCREEN (HOSP PERFORMED)      Result Value Ref Range   Opiates NONE DETECTED  NONE DETECTED   Cocaine NONE DETECTED  NONE DETECTED   Benzodiazepines NONE DETECTED  NONE DETECTED   Amphetamines NONE DETECTED  NONE DETECTED   Tetrahydrocannabinol POSITIVE (*) NONE DETECTED   Barbiturates NONE DETECTED  NONE DETECTED         MDM  Labs.  Psych team consulted.  Reviewed nursing notes and prior charts for additional history.   Will place on ciwa protocol.  Disposition per psych team.  Recheck alert, content, no tremor or shakes.      Suzi RootsKevin E Samson Ralph, MD 06/01/14 [phone]

## 2014-06-01 NOTE — ED Notes (Signed)
Pt presents requesting detox from alcohol.  Pt reports he drinks 4-5/24 oz beers daily, with occasional marijuana abuse.  Denies SI or HI, no AV hallucinations, complaint of depression.  Pt reports he detoxed in past, October 2014 and has stayed sober 18 mos in the past.  Pt calm & cooperative at present.

## 2014-06-02 ENCOUNTER — Encounter (HOSPITAL_COMMUNITY): Payer: Self-pay | Admitting: Psychiatry

## 2014-06-02 DIAGNOSIS — F1024 Alcohol dependence with alcohol-induced mood disorder: Secondary | ICD-10-CM

## 2014-06-02 DIAGNOSIS — F10230 Alcohol dependence with withdrawal, uncomplicated: Secondary | ICD-10-CM | POA: Diagnosis present

## 2014-06-02 DIAGNOSIS — F10239 Alcohol dependence with withdrawal, unspecified: Secondary | ICD-10-CM

## 2014-06-02 DIAGNOSIS — F1994 Other psychoactive substance use, unspecified with psychoactive substance-induced mood disorder: Secondary | ICD-10-CM | POA: Diagnosis present

## 2014-06-02 LAB — ETHANOL: Alcohol, Ethyl (B): 39 mg/dL — ABNORMAL HIGH (ref 0–11)

## 2014-06-02 MED ORDER — CLONIDINE HCL 0.1 MG PO TABS
0.1000 mg | ORAL_TABLET | Freq: Two times a day (BID) | ORAL | Status: DC
  Administered 2014-06-02: 0.1 mg via ORAL
  Filled 2014-06-02: qty 1

## 2014-06-02 MED ORDER — LORAZEPAM 1 MG PO TABS
2.0000 mg | ORAL_TABLET | Freq: Once | ORAL | Status: AC
  Administered 2014-06-02: 2 mg via ORAL
  Filled 2014-06-02: qty 2

## 2014-06-02 MED ORDER — CLONIDINE HCL 0.1 MG PO TABS: 0.1000 mg | ORAL_TABLET | Freq: Two times a day (BID) | ORAL | Status: DC

## 2014-06-02 MED ORDER — LORAZEPAM 2 MG PO TABS: 0.0000 mg | ORAL_TABLET | Freq: Four times a day (QID) | ORAL | Status: DC

## 2014-06-02 MED ORDER — LORAZEPAM 2 MG PO TABS: 0.0000 mg | ORAL_TABLET | Freq: Two times a day (BID) | ORAL | Status: DC

## 2014-06-02 MED ORDER — CLONIDINE HCL 0.1 MG PO TABS
0.2000 mg | ORAL_TABLET | Freq: Once | ORAL | Status: AC
  Administered 2014-06-02: 0.2 mg via ORAL
  Filled 2014-06-02: qty 2

## 2014-06-02 NOTE — Consult Note (Signed)
[facility] Face-to-Face Psychiatry Consult   Reason for Consult:  Alcohol dependence Referring Physician:  EDP  Jordan Hudson is an 28 y.o. male. Total Time spent with patient: 20 minutes  Assessment: AXIS I:  Alcohol Abuse/dependence with uncomplicated withdrawal; substance induced mood disorder AXIS II:  Deferred AXIS III:   Past Medical History  Diagnosis Date  . Depression     8 months  . Hypertension    AXIS IV:  other psychosocial or environmental problems, problems related to social environment and problems with primary support group AXIS V:  51-60 moderate symptoms  Plan:  Supportive therapy provided about ongoing stressors. Dr. Jannifer Franklin assessed the patient and concurs with the plan.  Subjective:   Jordan Hudson is a 28 y.o. male patient admitted to [facility] for alcohol detox/dependence.  HPI:  28 y.o. male who voluntarily presents to [facility] for alcohol detox. Pt denies SI/HI/AVH. Pt says he is stressed because: (1) he lost his job l month ago because of his drinking; (2) financial problems: (3) issues with his child's mother; (4) feels deserted by family/friends since losing his job. Pt says his alcohol consumption increased after his father died several yrs ago. Pt denies any current w/d sxs. Pt has legal charges: assault and driving w/o a license and court dates are 06/06/14 and 06/22/14. Pt reports drinking 80oz of beer, daily. His last drink was 06/01/14, he drank 1-24oz beer. Pt also smokes marijuana wkly, his last use was 2 days ago. Pt denies w/d sxs. Pt is tearful during interview.  HPI Elements:   Location:  generalized. Quality:  acute. Severity:  moderate. Timing:  constant. Duration:  few weeks. Context:  stressors.  Past Psychiatric History: Past Medical History  Diagnosis Date  . Depression     8 months  . Hypertension     reports that he has been smoking Cigarettes.  He has a 5 pack-year smoking history. He has never used smokeless tobacco. He reports that  he drinks alcohol. He reports that he uses illicit drugs (Marijuana). Family History  Problem Relation Age of Onset  . Hypertension Mother   . Heart failure Father   . Diabetes Father   . Hypertension Brother    Family History Substance Abuse: No Family Supports: Yes, List: (Mother) Living Arrangements: Alone Can pt return to current living arrangement?: Yes Abuse/Neglect [facility]) Physical Abuse: Denies Verbal Abuse: Denies Sexual Abuse: Denies Allergies:  No Known Allergies  ACT Assessment Complete:  Yes:    Educational Status    Risk to Self: Risk to self with the past 6 months Suicidal Ideation: No Suicidal Intent: No Is patient at risk for suicide?: No Suicidal Plan?: No Access to Means: No What has been your use of drugs/alcohol within the last 12 months?: Abusing: alcohol, thc  Previous Attempts/Gestures: No How many times?: 0 Other Self Harm Risks: None  Triggers for Past Attempts: None known Intentional Self Injurious Behavior: None Family Suicide History: No Recent stressful life event(s): Job Loss;Financial Problems;Conflict (Comment) (Issues with child's mother ) Persecutory voices/beliefs?: No Depression: Yes Depression Symptoms: Tearfulness;Loss of interest in usual pleasures Substance abuse history and/or treatment for substance abuse?: Yes Suicide prevention information given to non-admitted patients: Not applicable  Risk to Others: Risk to Others within the past 6 months Homicidal Ideation: No Thoughts of Harm to Others: No Current Homicidal Intent: No Current Homicidal Plan: No Access to Homicidal Means: No Identified Victim: None  History of harm to others?: No Assessment of Violence: None Noted  Violent Behavior Description: None  Does patient have access to weapons?: No Criminal Charges Pending?: Yes Describe Pending Criminal Charges: Assault; Driving w/o license  Does patient have a court date: Yes Court Date: 06/06/14 (06/22/14)  Abuse:  Abuse/Neglect Assessment (Assessment to be complete while patient is alone) Physical Abuse: Denies Verbal Abuse: Denies Sexual Abuse: Denies Exploitation of patient/patient's resources: Denies Self-Neglect: Denies  Prior Inpatient Therapy: Prior Inpatient Therapy Prior Inpatient Therapy: Yes Prior Therapy Dates: 2010,2013,2014 Prior Therapy Facilty/Provider(s): [facility], [facility], [facility]  Reason for Treatment: Detox/Rehab   Prior Outpatient Therapy: Prior Outpatient Therapy Prior Outpatient Therapy: No Prior Therapy Dates: None  Prior Therapy Facilty/Provider(s): None  Reason for Treatment: None   Additional Information: Additional Information 1:1 In Past 12 Months?: No CIRT Risk: No Elopement Risk: No Does patient have medical clearance?: Yes                  Objective: Blood pressure 145/92, pulse 95, temperature 98.8 F (37.1 C), temperature source Oral, resp. rate 18, SpO2 100.00%.There is no weight on file to calculate BMI. Results for orders placed during the hospital encounter of 06/01/14 (from the past 72 hour(s))  URINE RAPID DRUG SCREEN (HOSP PERFORMED)     Status: Abnormal   Collection Time    06/01/14  6:19 PM      Result Value Ref Range   Opiates NONE DETECTED  NONE DETECTED   Cocaine NONE DETECTED  NONE DETECTED   Benzodiazepines NONE DETECTED  NONE DETECTED   Amphetamines NONE DETECTED  NONE DETECTED   Tetrahydrocannabinol POSITIVE (*) NONE DETECTED   Barbiturates NONE DETECTED  NONE DETECTED   Comment:            DRUG SCREEN FOR MEDICAL PURPOSES     ONLY.  IF CONFIRMATION IS NEEDED     FOR ANY PURPOSE, NOTIFY LAB     WITHIN 5 DAYS.                LOWEST DETECTABLE LIMITS     FOR URINE DRUG SCREEN     Drug Class       Cutoff (ng/mL)     Amphetamine      1000     Barbiturate      200     Benzodiazepine   790     Tricyclics       240     Opiates          300     Cocaine          300     THC              50  ACETAMINOPHEN LEVEL     Status: None    Collection Time    06/01/14  6:32 PM      Result Value Ref Range   Acetaminophen (Tylenol), Serum <15.0  10 - 30 ug/mL   Comment:            THERAPEUTIC CONCENTRATIONS VARY     SIGNIFICANTLY. A RANGE OF 10-30     ug/mL MAY BE AN EFFECTIVE     CONCENTRATION FOR MANY PATIENTS.     HOWEVER, SOME ARE BEST TREATED     AT CONCENTRATIONS OUTSIDE THIS     RANGE.     ACETAMINOPHEN CONCENTRATIONS     >150 ug/mL AT 4 HOURS AFTER     INGESTION AND >50 ug/mL AT 12     HOURS AFTER INGESTION ARE  OFTEN ASSOCIATED WITH TOXIC     REACTIONS.  CBC     Status: None   Collection Time    06/01/14  6:32 PM      Result Value Ref Range   WBC 5.2  4.0 - 10.5 K/uL   RBC 4.51  4.22 - 5.81 MIL/uL   Hemoglobin 14.9  13.0 - 17.0 g/dL   HCT 42.7  39.0 - 52.0 %   MCV 94.7  78.0 - 100.0 fL   MCH 33.0  26.0 - 34.0 pg   MCHC 34.9  30.0 - 36.0 g/dL   RDW 14.9  11.5 - 15.5 %   Platelets 235  150 - 400 K/uL  COMPREHENSIVE METABOLIC PANEL     Status: Abnormal   Collection Time    06/01/14  6:32 PM      Result Value Ref Range   Sodium 143  137 - 147 mEq/L   Potassium 4.1  3.7 - 5.3 mEq/L   Chloride 97  96 - 112 mEq/L   CO2 28  19 - 32 mEq/L   Glucose, Bld 81  70 - 99 mg/dL   BUN 4 (*) 6 - 23 mg/dL   Creatinine, Ser 0.80  0.50 - 1.35 mg/dL   Calcium 9.8  8.4 - 10.5 mg/dL   Total Protein 9.3 (*) 6.0 - 8.3 g/dL   Albumin 5.4 (*) 3.5 - 5.2 g/dL   AST 145 (*) 0 - 37 U/L   ALT 88 (*) 0 - 53 U/L   Alkaline Phosphatase 65  39 - 117 U/L   Total Bilirubin 0.2 (*) 0.3 - 1.2 mg/dL   GFR calc non Af Amer >90  >90 mL/min   GFR calc Af Amer >90  >90 mL/min   Comment: (NOTE)     The eGFR has been calculated using the CKD EPI equation.     This calculation has not been validated in all clinical situations.     eGFR's persistently <90 mL/min signify possible Chronic Kidney     Disease.   Anion gap 18 (*) 5 - 15  ETHANOL     Status: Abnormal   Collection Time    06/01/14  6:32 PM      Result Value Ref Range    Alcohol, Ethyl (B) 346 (*) 0 - 11 mg/dL   Comment:            LOWEST DETECTABLE LIMIT FOR     SERUM ALCOHOL IS 11 mg/dL     FOR MEDICAL PURPOSES ONLY  SALICYLATE LEVEL     Status: Abnormal   Collection Time    06/01/14  6:32 PM      Result Value Ref Range   Salicylate Lvl <0.9 (*) 2.8 - 20.0 mg/dL  ETHANOL     Status: Abnormal   Collection Time    06/02/14  6:26 AM      Result Value Ref Range   Alcohol, Ethyl (B) 39 (*) 0 - 11 mg/dL   Comment:            LOWEST DETECTABLE LIMIT FOR     SERUM ALCOHOL IS 11 mg/dL     FOR MEDICAL PURPOSES ONLY   Labs are reviewed and are pertinent for no medical issues noted.  No current facility-administered medications for this encounter.   Current Outpatient Prescriptions  Medication Sig Dispense Refill  . cloNIDine (CATAPRES) 0.1 MG tablet Take 1 tablet (0.1 mg total) by mouth 2 (two) times daily.  14 tablet  0  . LORazepam (ATIVAN) 2 MG tablet Take 0-2 tablets (0-4 mg total) by mouth every 6 (six) hours.  30 tablet  0  . [START ON 06/03/2014] LORazepam (ATIVAN) 2 MG tablet Take 0-2 tablets (0-4 mg total) by mouth every 12 (twelve) hours.  30 tablet  0    Psychiatric Specialty Exam:     Blood pressure 145/92, pulse 95, temperature 98.8 F (37.1 C), temperature source Oral, resp. rate 18, SpO2 100.00%.There is no weight on file to calculate BMI.  General Appearance: Casual  Eye Contact::  Good  Speech:  Normal Rate  Volume:  Normal  Mood:  Depressed  Affect:  Congruent  Thought Process:  Coherent  Orientation:  Full (Time, Place, and Person)  Thought Content:  WDL  Suicidal Thoughts:  No  Homicidal Thoughts:  No  Memory:  Immediate;   Fair Recent;   Fair Remote;   Fair  Judgement:  Poor  Insight:  Fair  Psychomotor Activity:  Decreased  Concentration:  Fair  Recall:  [employer] of Knowledge: Fair  Language: Fair  Akathisia:  No  Handed:  Right  AIMS (if indicated):     Assets:  Leisure Time Physical  Health Resilience Social Support  Sleep:      Musculoskeletal: Strength & Muscle Tone: within normal limits Gait & Station: normal Patient leans: N/A  Treatment Plan Summary: Daily contact with patient to assess and evaluate symptoms and progress in treatment Medication management; CIWA alcohol detox protocol started, admit to [facility] for detox/dependence  Waylan Boga, PMH-NP 06/02/2014 5:58 PM  Patient seen, evaluated and I agree with notes by Nurse Practitioner. Corena Pilgrim, MD

## 2014-06-02 NOTE — Progress Notes (Signed)
Patient states that he does not take any blood pressure medications. CSW discussed with MD, NP, and [facility] who agreed for patient to remain on clonidine protocol. [facility] states that they will be able to provide clonidine protocol.   Byrd HesselbachKristen Lurlean Kernen, LCSW [phone]  ED CSW 06/02/2014 1040am

## 2017-06-29 ENCOUNTER — Encounter (HOSPITAL_COMMUNITY): Payer: Self-pay | Admitting: Emergency Medicine

## 2017-06-29 ENCOUNTER — Emergency Department (HOSPITAL_COMMUNITY): Payer: Self-pay

## 2017-06-29 ENCOUNTER — Inpatient Hospital Stay (HOSPITAL_COMMUNITY)
Admission: EM | Admit: 2017-06-29 | Discharge: 2017-07-02 | DRG: 871 | Disposition: A | Discharge status: Home / Self Care | Payer: Self-pay | Attending: Family Medicine | Admitting: Family Medicine

## 2017-06-29 DIAGNOSIS — R40241 Glasgow coma scale score 13-15, unspecified time: Secondary | ICD-10-CM | POA: Diagnosis present

## 2017-06-29 DIAGNOSIS — J189 Pneumonia, unspecified organism: Secondary | ICD-10-CM

## 2017-06-29 DIAGNOSIS — F17210 Nicotine dependence, cigarettes, uncomplicated: Secondary | ICD-10-CM | POA: Diagnosis present

## 2017-06-29 DIAGNOSIS — J13 Pneumonia due to Streptococcus pneumoniae: Secondary | ICD-10-CM | POA: Insufficient documentation

## 2017-06-29 DIAGNOSIS — R6520 Severe sepsis without septic shock: Secondary | ICD-10-CM

## 2017-06-29 DIAGNOSIS — A403 Sepsis due to Streptococcus pneumoniae: Secondary | ICD-10-CM | POA: Insufficient documentation

## 2017-06-29 DIAGNOSIS — J154 Pneumonia due to other streptococci: Secondary | ICD-10-CM | POA: Diagnosis present

## 2017-06-29 DIAGNOSIS — E876 Hypokalemia: Secondary | ICD-10-CM | POA: Diagnosis present

## 2017-06-29 DIAGNOSIS — F10931 Alcohol use, unspecified with withdrawal delirium: Secondary | ICD-10-CM | POA: Diagnosis present

## 2017-06-29 DIAGNOSIS — J181 Lobar pneumonia, unspecified organism: Secondary | ICD-10-CM

## 2017-06-29 DIAGNOSIS — F10231 Alcohol dependence with withdrawal delirium: Secondary | ICD-10-CM

## 2017-06-29 DIAGNOSIS — R441 Visual hallucinations: Secondary | ICD-10-CM | POA: Diagnosis present

## 2017-06-29 DIAGNOSIS — G9340 Encephalopathy, unspecified: Secondary | ICD-10-CM | POA: Diagnosis present

## 2017-06-29 DIAGNOSIS — I10 Essential (primary) hypertension: Secondary | ICD-10-CM | POA: Diagnosis present

## 2017-06-29 DIAGNOSIS — F329 Major depressive disorder, single episode, unspecified: Secondary | ICD-10-CM | POA: Diagnosis present

## 2017-06-29 DIAGNOSIS — R440 Auditory hallucinations: Secondary | ICD-10-CM | POA: Diagnosis present

## 2017-06-29 DIAGNOSIS — A419 Sepsis, unspecified organism: Principal | ICD-10-CM

## 2017-06-29 LAB — COMPREHENSIVE METABOLIC PANEL
ALBUMIN: 3.4 g/dL — AB (ref 3.5–5.0)
ALT: 11 U/L — ABNORMAL LOW (ref 17–63)
ANION GAP: 17 — AB (ref 5–15)
AST: 20 U/L (ref 15–41)
Alkaline Phosphatase: 78 U/L (ref 38–126)
BUN: <5 mg/dL — ABNORMAL LOW (ref 6–20)
CO2: 26 mmol/L (ref 22–32)
Calcium: 9 mg/dL (ref 8.9–10.3)
Chloride: 90 mmol/L — ABNORMAL LOW (ref 101–111)
Creatinine, Ser: 0.73 mg/dL (ref 0.61–1.24)
GFR calc Af Amer: >60 mL/min (ref >60)
GFR calc non Af Amer: >60 mL/min (ref >60)
GLUCOSE: 137 mg/dL — AB (ref 65–99)
Potassium: 2.6 mmol/L — CL (ref 3.5–5.1)
SODIUM: 133 mmol/L — AB (ref 135–145)
Total Bilirubin: 0.8 mg/dL (ref 0.3–1.2)
Total Protein: 8.1 g/dL (ref 6.5–8.1)

## 2017-06-29 LAB — BLOOD CULTURE ID PANEL (REFLEXED)
ACINETOBACTER BAUMANNII: NOT DETECTED
CANDIDA ALBICANS: NOT DETECTED
CANDIDA PARAPSILOSIS: NOT DETECTED
Candida glabrata: NOT DETECTED
Candida krusei: NOT DETECTED
Candida tropicalis: NOT DETECTED
ENTEROBACTERIACEAE SPECIES: NOT DETECTED
ENTEROCOCCUS SPECIES: NOT DETECTED
Enterobacter cloacae complex: NOT DETECTED
Escherichia coli: NOT DETECTED
HAEMOPHILUS INFLUENZAE: NOT DETECTED
KLEBSIELLA OXYTOCA: NOT DETECTED
Klebsiella pneumoniae: NOT DETECTED
LISTERIA MONOCYTOGENES: NOT DETECTED
Neisseria meningitidis: NOT DETECTED
Proteus species: NOT DETECTED
Pseudomonas aeruginosa: NOT DETECTED
SERRATIA MARCESCENS: NOT DETECTED
STAPHYLOCOCCUS AUREUS BCID: NOT DETECTED
STREPTOCOCCUS AGALACTIAE: NOT DETECTED
STREPTOCOCCUS PNEUMONIAE: DETECTED — AB
STREPTOCOCCUS PYOGENES: NOT DETECTED
STREPTOCOCCUS SPECIES: DETECTED — AB
Staphylococcus species: NOT DETECTED

## 2017-06-29 LAB — RAPID URINE DRUG SCREEN, HOSP PERFORMED
AMPHETAMINES: NOT DETECTED
Barbiturates: NOT DETECTED
Benzodiazepines: NOT DETECTED
COCAINE: NOT DETECTED
OPIATES: NOT DETECTED
Tetrahydrocannabinol: POSITIVE — AB

## 2017-06-29 LAB — CBC WITH DIFFERENTIAL/PLATELET
Basophils Absolute: 0 10*3/uL (ref 0.0–0.1)
Basophils Relative: 0 %
EOS PCT: 0 %
Eosinophils Absolute: 0 10*3/uL (ref 0.0–0.7)
HCT: 35.1 % — ABNORMAL LOW (ref 39.0–52.0)
Hemoglobin: 12.7 g/dL — ABNORMAL LOW (ref 13.0–17.0)
LYMPHS ABS: 0.9 10*3/uL (ref 0.7–4.0)
Lymphocytes Relative: 7 %
MCH: 35.3 pg — AB (ref 26.0–34.0)
MCHC: 36.2 g/dL — ABNORMAL HIGH (ref 30.0–36.0)
MCV: 97.5 fL (ref 78.0–100.0)
Monocytes Absolute: 1.5 10*3/uL — ABNORMAL HIGH (ref 0.1–1.0)
Monocytes Relative: 12 %
NEUTROS ABS: 10.2 10*3/uL — AB (ref 1.7–7.7)
Neutrophils Relative %: 81 %
Platelets: 283 10*3/uL (ref 150–400)
RBC: 3.6 MIL/uL — ABNORMAL LOW (ref 4.22–5.81)
RDW: 13.9 % (ref 11.5–15.5)
WBC: 12.6 10*3/uL — ABNORMAL HIGH (ref 4.0–10.5)

## 2017-06-29 LAB — BASIC METABOLIC PANEL
Anion gap: 13 (ref 5–15)
CO2: 23 mmol/L (ref 22–32)
CREATININE: 0.7 mg/dL (ref 0.61–1.24)
Calcium: 8.5 mg/dL — ABNORMAL LOW (ref 8.9–10.3)
Chloride: 101 mmol/L (ref 101–111)
GFR calc Af Amer: >60 mL/min (ref >60)
Glucose, Bld: 85 mg/dL (ref 65–99)
Potassium: 3 mmol/L — ABNORMAL LOW (ref 3.5–5.1)
SODIUM: 137 mmol/L (ref 135–145)

## 2017-06-29 LAB — URINALYSIS, ROUTINE W REFLEX MICROSCOPIC
Bilirubin Urine: NEGATIVE
Glucose, UA: NEGATIVE mg/dL
HGB URINE DIPSTICK: NEGATIVE
Ketones, ur: NEGATIVE mg/dL
Leukocytes, UA: NEGATIVE
Nitrite: NEGATIVE
PH: 7.5 (ref 5.0–8.0)
Protein, ur: NEGATIVE mg/dL
Specific Gravity, Urine: <1.005 — ABNORMAL LOW (ref 1.005–1.030)

## 2017-06-29 LAB — I-STAT CG4 LACTIC ACID, ED
LACTIC ACID, VENOUS: 1.96 mmol/L — AB (ref 0.5–1.9)
Lactic Acid, Venous: 1.59 mmol/L (ref 0.5–1.9)

## 2017-06-29 LAB — MRSA PCR SCREENING: MRSA BY PCR: NEGATIVE

## 2017-06-29 LAB — SALICYLATE LEVEL: Salicylate Lvl: <7 mg/dL (ref 2.8–30.0)

## 2017-06-29 LAB — STREP PNEUMONIAE URINARY ANTIGEN: STREP PNEUMO URINARY ANTIGEN: POSITIVE — AB

## 2017-06-29 LAB — ACETAMINOPHEN LEVEL: Acetaminophen (Tylenol), Serum: <10 ug/mL — ABNORMAL LOW (ref 10–30)

## 2017-06-29 LAB — INFLUENZA PANEL BY PCR (TYPE A & B)
Influenza A By PCR: NEGATIVE
Influenza B By PCR: NEGATIVE

## 2017-06-29 LAB — MAGNESIUM: MAGNESIUM: 1.6 mg/dL — AB (ref 1.7–2.4)

## 2017-06-29 LAB — ETHANOL: Alcohol, Ethyl (B): <10 mg/dL (ref <10)

## 2017-06-29 LAB — PROCALCITONIN: PROCALCITONIN: 2.07 ng/mL

## 2017-06-29 MED ORDER — FOLIC ACID 1 MG PO TABS
1.0000 mg | ORAL_TABLET | Freq: Every day | ORAL | Status: DC
  Administered 2017-06-29 – 2017-07-02 (×4): 1 mg via ORAL
  Filled 2017-06-29 (×4): qty 1

## 2017-06-29 MED ORDER — ENOXAPARIN SODIUM 40 MG/0.4ML ~~LOC~~ SOLN: 40.0000 mg | SUBCUTANEOUS | Status: DC

## 2017-06-29 MED ORDER — HYDRALAZINE HCL 20 MG/ML IJ SOLN
10.0000 mg | INTRAMUSCULAR | Status: DC | PRN
  Administered 2017-06-29 – 2017-06-30 (×2): 10 mg via INTRAVENOUS
  Filled 2017-06-29 (×2): qty 1

## 2017-06-29 MED ORDER — SODIUM CHLORIDE 0.9 % IV BOLUS (SEPSIS)
1000.0000 mL | Freq: Once | INTRAVENOUS | Status: AC
  Administered 2017-06-29: 1000 mL via INTRAVENOUS

## 2017-06-29 MED ORDER — SODIUM CHLORIDE 0.9 % IV SOLN
250.0000 mL | INTRAVENOUS | Status: DC | PRN
Start: 2017-06-29 — End: 2017-07-02

## 2017-06-29 MED ORDER — MAGNESIUM SULFATE 2 GM/50ML IV SOLN
2.0000 g | Freq: Once | INTRAVENOUS | Status: AC
Start: 2017-06-29 — End: 2017-06-29
  Administered 2017-06-29: 2 g via INTRAVENOUS
  Filled 2017-06-29: qty 50

## 2017-06-29 MED ORDER — LORAZEPAM 2 MG/ML IJ SOLN
1.0000 mg | Freq: Once | INTRAMUSCULAR | Status: AC
  Administered 2017-06-29: 1 mg via INTRAVENOUS
  Filled 2017-06-29: qty 1

## 2017-06-29 MED ORDER — POTASSIUM CHLORIDE 10 MEQ/100ML IV SOLN
10.0000 meq | INTRAVENOUS | Status: AC
  Administered 2017-06-29 (×3): 10 meq via INTRAVENOUS
  Filled 2017-06-29 (×3): qty 100

## 2017-06-29 MED ORDER — MAGNESIUM SULFATE 2 GM/50ML IV SOLN: 2.0000 g | Freq: Once | INTRAVENOUS | Status: DC

## 2017-06-29 MED ORDER — ASPIRIN 300 MG RE SUPP: 300.0000 mg | RECTAL | Status: AC

## 2017-06-29 MED ORDER — NICOTINE 21 MG/24HR TD PT24
21.0000 mg | MEDICATED_PATCH | Freq: Once | TRANSDERMAL | Status: AC
  Administered 2017-06-29: 21 mg via TRANSDERMAL
  Filled 2017-06-29: qty 1

## 2017-06-29 MED ORDER — ENOXAPARIN SODIUM 40 MG/0.4ML ~~LOC~~ SOLN
40.0000 mg | SUBCUTANEOUS | Status: DC
  Administered 2017-06-29 – 2017-06-30 (×2): 40 mg via SUBCUTANEOUS
  Filled 2017-06-29 (×2): qty 0.4

## 2017-06-29 MED ORDER — ASPIRIN 81 MG PO CHEW
324.0000 mg | CHEWABLE_TABLET | ORAL | Status: AC
  Administered 2017-06-29: 324 mg via ORAL
  Filled 2017-06-29: qty 4

## 2017-06-29 MED ORDER — CLONIDINE HCL 0.1 MG/24HR TD PTWK
0.1000 mg | MEDICATED_PATCH | TRANSDERMAL | Status: DC
  Administered 2017-06-29: 0.1 mg via TRANSDERMAL
  Filled 2017-06-29: qty 1

## 2017-06-29 MED ORDER — PANTOPRAZOLE SODIUM 40 MG IV SOLR
40.0000 mg | Freq: Every day | INTRAVENOUS | Status: DC
  Administered 2017-06-29 – 2017-07-01 (×3): 40 mg via INTRAVENOUS
  Filled 2017-06-29 (×3): qty 40

## 2017-06-29 MED ORDER — VANCOMYCIN HCL IN DEXTROSE 1-5 GM/200ML-% IV SOLN
1000.0000 mg | Freq: Three times a day (TID) | INTRAVENOUS | Status: DC
  Administered 2017-06-29 – 2017-06-30 (×3): 1000 mg via INTRAVENOUS
  Filled 2017-06-29 (×3): qty 200

## 2017-06-29 MED ORDER — VITAMIN B-1 100 MG PO TABS
100.0000 mg | ORAL_TABLET | Freq: Every day | ORAL | Status: DC
  Administered 2017-06-29 – 2017-07-02 (×4): 100 mg via ORAL
  Filled 2017-06-29 (×4): qty 1

## 2017-06-29 MED ORDER — VANCOMYCIN HCL IN DEXTROSE 1-5 GM/200ML-% IV SOLN
1000.0000 mg | Freq: Once | INTRAVENOUS | Status: AC
Start: 2017-06-29 — End: 2017-06-29
  Administered 2017-06-29: 1000 mg via INTRAVENOUS
  Filled 2017-06-29 (×2): qty 200

## 2017-06-29 MED ORDER — DEXTROSE 5 % IV SOLN
2.0000 g | Freq: Every day | INTRAVENOUS | Status: DC
  Administered 2017-06-29 – 2017-07-01 (×3): 2 g via INTRAVENOUS
  Filled 2017-06-29 (×4): qty 2

## 2017-06-29 MED ORDER — HYDRALAZINE HCL 20 MG/ML IJ SOLN
10.0000 mg | Freq: Four times a day (QID) | INTRAMUSCULAR | Status: DC | PRN
  Administered 2017-06-29: 10 mg via INTRAVENOUS
  Filled 2017-06-29: qty 1

## 2017-06-29 MED ORDER — DEXMEDETOMIDINE HCL IN NACL 200 MCG/50ML IV SOLN
0.4000 ug/kg/h | INTRAVENOUS | Status: DC
  Administered 2017-06-29: 0.4 ug/kg/h via INTRAVENOUS
  Filled 2017-06-29: qty 50

## 2017-06-29 MED ORDER — PIPERACILLIN-TAZOBACTAM 3.375 G IVPB
3.3750 g | Freq: Three times a day (TID) | INTRAVENOUS | Status: DC
  Administered 2017-06-29 (×2): 3.375 g via INTRAVENOUS
  Filled 2017-06-29 (×4): qty 50

## 2017-06-29 MED ORDER — SODIUM CHLORIDE 0.9 % IV SOLN: 0.4000 ug/kg/h | INTRAVENOUS | Status: DC

## 2017-06-29 MED ORDER — LORAZEPAM 2 MG/ML IJ SOLN
1.0000 mg | INTRAMUSCULAR | Status: DC | PRN
  Administered 2017-06-29 (×2): 2 mg via INTRAVENOUS
  Filled 2017-06-29 (×3): qty 1

## 2017-06-29 MED ORDER — ACETAMINOPHEN 500 MG PO TABS
1000.0000 mg | ORAL_TABLET | Freq: Once | ORAL | Status: AC
  Administered 2017-06-29: 1000 mg via ORAL
  Filled 2017-06-29: qty 2

## 2017-06-29 MED ORDER — PIPERACILLIN-TAZOBACTAM 3.375 G IVPB 30 MIN
3.3750 g | Freq: Once | INTRAVENOUS | Status: AC
Start: 2017-06-29 — End: 2017-06-29
  Administered 2017-06-29: 3.375 g via INTRAVENOUS
  Filled 2017-06-29 (×2): qty 50

## 2017-06-29 MED ORDER — POTASSIUM CHLORIDE CRYS ER 20 MEQ PO TBCR
20.0000 meq | EXTENDED_RELEASE_TABLET | ORAL | Status: AC
  Administered 2017-06-29: 20 meq via ORAL
  Filled 2017-06-29: qty 1

## 2017-06-29 MED ORDER — DEXMEDETOMIDINE HCL IN NACL 200 MCG/50ML IV SOLN
0.4000 ug/kg/h | INTRAVENOUS | Status: DC
  Administered 2017-06-29: 1.2 ug/kg/h via INTRAVENOUS
  Administered 2017-06-29: 1 ug/kg/h via INTRAVENOUS
  Administered 2017-06-29: 0.9 ug/kg/h via INTRAVENOUS
  Administered 2017-06-29: 0.8 ug/kg/h via INTRAVENOUS
  Administered 2017-06-29: 0.6 ug/kg/h via INTRAVENOUS
  Administered 2017-06-30 (×2): 0.9 ug/kg/h via INTRAVENOUS
  Filled 2017-06-29 (×8): qty 50

## 2017-06-29 MED ORDER — ADULT MULTIVITAMIN W/MINERALS CH
1.0000 | ORAL_TABLET | Freq: Every day | ORAL | Status: DC
  Administered 2017-06-29 – 2017-07-02 (×4): 1 via ORAL
  Filled 2017-06-29 (×4): qty 1

## 2017-06-29 MED ORDER — POTASSIUM & SODIUM PHOSPHATES 280-160-250 MG PO PACK
1.0000 | PACK | Freq: Three times a day (TID) | ORAL | Status: DC
  Administered 2017-06-29 – 2017-07-02 (×9): 1 via ORAL
  Filled 2017-06-29 (×16): qty 1

## 2017-06-29 MED ORDER — NITROGLYCERIN 2 % TD OINT
1.0000 [in_us] | TOPICAL_OINTMENT | Freq: Four times a day (QID) | TRANSDERMAL | Status: DC
  Administered 2017-06-29 – 2017-07-01 (×6): 1 [in_us] via TOPICAL
  Filled 2017-06-29: qty 30

## 2017-06-29 MED ORDER — ONDANSETRON HCL 4 MG/2ML IJ SOLN
4.0000 mg | Freq: Four times a day (QID) | INTRAMUSCULAR | Status: DC | PRN
  Administered 2017-06-29: 4 mg via INTRAVENOUS
  Filled 2017-06-29: qty 2

## 2017-06-29 NOTE — ED Notes (Addendum)
More precedex ordered from pharmacy. Will start medication again once it's received.

## 2017-06-29 NOTE — Progress Notes (Signed)
A consult was received from an ED physician for Vancomycin and Zosyn per pharmacy dosing.  The patient's profile has been reviewed for ht/wt/allergies/indication/available labs.   A one time order has been placed for Vancomycin 1gm IV and Zosyn 3.375gm IV.  Further antibiotics/pharmacy consults should be ordered by admitting physician if indicated.                       Thank you, Maryellen PilePoindexter, Shunna Mikaelian Trefz, PharmD 06/29/2017  2:54 AM

## 2017-06-29 NOTE — ED Notes (Signed)
No respiratory or acute distress noted alert and oriented x 3 call light in reach. 

## 2017-06-29 NOTE — ED Notes (Addendum)
Mother is sitting with patient and helping to keep him calm. Pt is still very anxious and agitated.

## 2017-06-29 NOTE — ED Notes (Signed)
Patient denies pain and is resting comfortably.  

## 2017-06-29 NOTE — Progress Notes (Signed)
Pt's BP remained elevated while patient was resting in the bed on 1.2 of Precedex.  Ativan not indicated due to patient resting.  HR then decreased briefly to mid- 40's, but came back up to high-50's low-60's.  Precedex decreased to 0.9.  MD made aware of HR dropping briefly as well as SBP remaining >200.   Orders placed for 0.1 mg Clonidine patch and an order for PRN IV Hydralazine. Orders executed.   Will continue to monitor.

## 2017-06-29 NOTE — ED Notes (Signed)
Pt trying to get out of bed no reaction to medication noted alert but confused talking about things not in room call light in reach no respiratory or acute distress noted.

## 2017-06-29 NOTE — Progress Notes (Signed)
eLink Physician-Brief Progress Note Patient Name: Jordan DareDaniel D Waltner DOB: 02/10/1986 MRN: [id number]   Date of Service  06/29/2017  HPI/Events of Note  Hypertension - BP = 194/108. Patient is already on Hydralazine 10 mg IV Q 6 hours and a Carapres Patch 0.1.  eICU Interventions  Will order: 1. Increase Hydralazine 10 mg IV to Q 4 hours PRN. 2. Nitroglycerin ointment 2% 1 inch to skin Q 6 hours. Hold for SBP < 105.      Intervention Category Major Interventions: Hypertension - evaluation and management  Ledora Delker Eugene 06/29/2017, 8:03 PM

## 2017-06-29 NOTE — ED Triage Notes (Signed)
Pt reports having fever and cold symptoms for the last several days and states that he [facility] 1600 on 06/28/17. Pt states he has been also drinking beer and has been hearing voices from phone app.

## 2017-06-29 NOTE — Progress Notes (Signed)
PHARMACY - PHYSICIAN COMMUNICATION CRITICAL VALUE ALERT - BLOOD CULTURE IDENTIFICATION (BCID)  Results for orders placed or performed during the hospital encounter of 06/29/17  Blood Culture ID Panel (Reflexed) (Collected: 06/29/2017  3:00 AM)  Result Value Ref Range   Enterococcus species NOT DETECTED NOT DETECTED   Listeria monocytogenes NOT DETECTED NOT DETECTED   Staphylococcus species NOT DETECTED NOT DETECTED   Staphylococcus aureus NOT DETECTED NOT DETECTED   Streptococcus species DETECTED (A) NOT DETECTED   Streptococcus agalactiae NOT DETECTED NOT DETECTED   Streptococcus pneumoniae DETECTED (A) NOT DETECTED   Streptococcus pyogenes NOT DETECTED NOT DETECTED   Acinetobacter baumannii NOT DETECTED NOT DETECTED   Enterobacteriaceae species NOT DETECTED NOT DETECTED   Enterobacter cloacae complex NOT DETECTED NOT DETECTED   Escherichia coli NOT DETECTED NOT DETECTED   Klebsiella oxytoca NOT DETECTED NOT DETECTED   Klebsiella pneumoniae NOT DETECTED NOT DETECTED   Proteus species NOT DETECTED NOT DETECTED   Serratia marcescens NOT DETECTED NOT DETECTED   Haemophilus influenzae NOT DETECTED NOT DETECTED   Neisseria meningitidis NOT DETECTED NOT DETECTED   Pseudomonas aeruginosa NOT DETECTED NOT DETECTED   Candida albicans NOT DETECTED NOT DETECTED   Candida glabrata NOT DETECTED NOT DETECTED   Candida krusei NOT DETECTED NOT DETECTED   Candida parapsilosis NOT DETECTED NOT DETECTED   Candida tropicalis NOT DETECTED NOT DETECTED    Name of physician (or Provider) Contacted: Dr Dellie CatholicSommers  Changes to prescribed antibiotics required: all 4 bottle Strep pneumoniae.  Change Zosyn to Rocephin and keep Vanc for now per MD  Lorenza EvangelistGreen, Trapper Meech R 06/29/2017  11:35 PM

## 2017-06-29 NOTE — ED Notes (Signed)
PT removed IV from Left AC. Site dressed and IV access restarted in right wrist.

## 2017-06-29 NOTE — H&P (Signed)
.. ..  Name: Jordan Hudson MRN: [id number] DOB: [birth year]    ADMISSION DATE:  06/29/2017 CONSULTATION DATE: 06/29/17  REFERRING MD :  ER PHYSICIAN  CHIEF COMPLAINT:  Per Triage notes: Pt reported having fever and cold symptoms for the last several days and states that he may have took to much of the [facility] Cold and Flu medication at 1600 on 06/28/17. Pt states he has been also drinking beer and has been hearing voices from phone app  BRIEF PATIENT DESCRIPTION: 31 yr old male with PMHx of ETOH abuse (multiple admissions in the past dating back to 2012/2013) for detox, presents today with fever and cold symptoms. He reports trying to treat these symptoms with OTC Acetaminophen. His last drink was within last 24 hrs however on average he drinks 48-72 ounces of beer and he only reports drinking 24 oz of beer on 10/28. Concern for active ETOH withdrawal. PCCM consulted  SIGNIFICANT EVENTS  Hallucinations, elevated CIWA, Fever  STUDIES:  CXR - LLL consolidation suggestive of PNA Alcohol Ethyl level <10 Salicylate level <7 +THC on UDS   HISTORY OF PRESENT ILLNESS:   31 yr old male with PMHx of HTN, Depression, and ETOH abuse (multiple admissions in the past dating back to 2012/2013) for detox, presents today with fever, cold symptoms. He reports trying to treat these symptoms with OTC Acetaminophen. His last drink was within last 24 hrs however on average he drinks 48-72 ounces of beer and he only reports drinking 24 oz of beer on 10/28. Concern for active ETOH withdrawal. PCCM consulted T max: 103.1 During my evaluation pt denies any pain, he responds to most questions intermittently sometimes answers not clear and coherent. He claims to see things in the room and responds to things he hears.   PAST MEDICAL HISTORY :   has a past medical history of Depression and Hypertension.  has no past surgical history on file. Prior to Admission medications   Medication Sig Start Date End  Date Taking? Authorizing Provider  ibuprofen (ADVIL,MOTRIN) 200 MG tablet Take 400 mg by mouth every 6 (six) hours as needed for headache, mild pain or moderate pain.   Yes [provider]  cloNIDine (CATAPRES) 0.1 MG tablet Take 1 tablet (0.1 mg total) by mouth 2 (two) times daily. Patient not taking: Reported on 06/29/2017 06/02/14   Charm Rings, NP   No Known Allergies  FAMILY HISTORY:  family history includes Diabetes in his father; Heart failure in his father; Hypertension in his brother and mother. SOCIAL HISTORY:  reports that he has been smoking Cigarettes.  He has a 5.00 pack-year smoking history. He has never used smokeless tobacco. He reports that he drinks alcohol. He reports that he uses drugs, including Marijuana.  REVIEW OF SYSTEMS:  Bolded items are pertinent positives Constitutional: Negative for fever, chills, weight loss, malaise/fatigue and diaphoresis.  HENT: Negative for hearing loss, ear pain, nosebleeds, congestion, sore throat, neck pain, tinnitus and ear discharge.   Eyes: Negative for blurred vision, double vision, photophobia, pain, discharge and redness.  Respiratory: Negative for cough, hemoptysis, sputum production, shortness of breath, wheezing and stridor.   Cardiovascular: Negative for chest pain, palpitations, orthopnea, claudication, leg swelling and PND.  Gastrointestinal: Negative for heartburn, nausea, vomiting, abdominal pain, diarrhea, constipation, blood in stool and melena.  Genitourinary: Negative for dysuria, urgency, frequency, hematuria and flank pain.  Musculoskeletal: Negative for myalgias, back pain, joint pain and falls.  Skin: Negative for itching and rash.  Neurological:  Negative for dizziness, tingling, tremors, sensory change, speech change, focal weakness, seizures, loss of consciousness, weakness and headaches. hallucinations Endo/Heme/Allergies: Negative for environmental allergies and polydipsia. Does not bruise/bleed  easily.  SUBJECTIVE:   VITAL SIGNS: Temp:  [100.1 F (37.8 C)-103.1 F (39.5 C)] 100.1 F (37.8 C) (10/29 0444) Pulse Rate:  [95-132] 100 (10/29 0534) Resp:  [20-30] 21 (10/29 0534) BP: (156-181)/(93-117) 156/105 (10/29 0534) SpO2:  [95 %-100 %] 97 % (10/29 0534) Weight:  [65.8 kg (145 lb)] 65.8 kg (145 lb) (10/29 0232)  PHYSICAL EXAMINATION: General:  Well nourished male  Neuro: no focal deficits follows commands, visual hallucinations, incoherent speech HEENT: tongue fasciculations, normocephalic atraumatic Cardiovascular:  Tachycardic S1 and S2 appreciated Lungs: left base diminished no wheezing no crackles Abdomen:  Soft + BS some tenderness on deep palpation of lower abdomen Musculoskeletal:  No deformities Skin:  Intact, tattoos   Recent Labs Lab 06/29/17 0234  NA 133*  K 2.6*  CL 90*  CO2 26  BUN <5*  CREATININE 0.73  GLUCOSE 137*    Recent Labs Lab 06/29/17 0234  HGB 12.7*  HCT 35.1*  WBC 12.6*  PLT 283   Dg Chest 2 View  Result Date: 06/29/2017 CLINICAL DATA:  Cough, chest pain, fever, shortness of breath. Smoker. EXAM: CHEST  2 VIEW COMPARISON:  None. FINDINGS: Dense consolidation LEFT lower lobe. No pleural effusion. Cardiomediastinal silhouette is normal. No pneumothorax. Soft tissue planes and included osseous structures are normal. IMPRESSION: LEFT lower lobe pneumonia. Electronically Signed   By: Awilda Metroourtnay  Bloomer M.D.   On: 06/29/2017 03:54    ASSESSMENT / PLAN: NEURO: Acute ETOH withdrawal- hyperactive GCS 13 Having active hallucinations (visual and auditory) No Seizure activity Started on Precedex for withdrawal symptoms No h/o fall or trauma on this admission   CARDIAC: Hemodynamically stable Hypertensive initially - most likely 2/2 ETOH withdrawal BP improving on Precedex ggt PMHx of HTN currently BP controlled   PULMONARY: Community acquired pneumonia vs Aspiration LLL consolidation noted Continues on empiric coverage with  Vancomycin and Zosyn IV Not currently intubated Protecting airway Sat >98%  ID: Community Acquired Pneumonia vs Aspiration LLL consolidation  Blood cx x 2 sent Pt covered with empiric abx LA 1.59 from 1.9 trending down Trend WBC, and fever curve Sent PCT for baseline  Endocrine: No acute issues No previous history of insulin dependence NPO for now  Monitor BG with BMET No need for insulin ss at this time  GI: NPO Aspiration precautions HOB elevated >30 deg PPI Prn Zofran for nausea   Heme: Hemoglobin- 12.7 Platelets- 203 If Hgb<7 transfuse PRBCs No signs of  Any active bleeding No previous h/o coagulopathy DVT PPx-> heparin [place] and VTE ppx  RENAL GFR >60 .Marland Kitchen. Lab Results  Component Value Date   CREATININE 0.73 06/29/2017   CREATININE 0.80 06/01/2014   CREATININE 0.88 12/02/2011  Hypokalemia and Hypomagnesemia Repeat K+ Manage repletion of electrolytes Most recent sodium 133    .Marland Kitchen. Code Status History    Date Active Date Inactive Code Status Order ID Comments User Context   06/01/2014  6:54 PM 06/02/2014  4:57 PM Full Code [id number]  Suzi RootsSteinl, Kevin E, MD ED   12/02/2011  9:22 PM 12/03/2011  6:16 AM Full Code [id number]  Anne ShutterVan Wingen, Herbert SetaHeather, PA-C ED        I, Dr Newell CoralKristen Scatliffe have personally reviewed patient's available data, including medical history, events of note, physical examination and test results as part of my evaluation. I have discussed with  resident/NP and other care providers such as pharmacist, RN and [facility]. The patient is critically ill with multiple organ systems failure and requires high complexity decision making for assessment and support, frequent evaluation and titration of therapies, application of advanced monitoring technologies and extensive interpretation of multiple databases. Critical Care Time devoted to patient care services described in this note is 45 Minutes. This time reflects time of care of this signee Dr Newell Coral. This  critical care time does not reflect procedure time, or teaching time or supervisory time of PA/NP/Med student/Med Resident etc but could involve care discussion time    DISPOSITION:ICU CC TIME: 45 mins PROGNOSIS:Guarded FAMILY: Mother   Signed Dr Newell Coral Pulmonary Critical Care Locums Pulmonary and Critical Care Medicine [facility] Pager: [phone]  06/29/2017, 6:00 AM

## 2017-06-29 NOTE — ED Notes (Signed)
Date and time results received: 06/29/17 3:41 AM  (use smartphrase ".now" to insert current time)  Test: Potassium Critical Value: 2.6  Name of Provider Notified: Antony MaduraKelly Humes, PA  Orders Received? Or Actions Taken?:

## 2017-06-29 NOTE — ED Notes (Signed)
Pt pulled out right FA IV and continues to be agitated. MD Byrum made aware. New orders will be initiated for Ativan.

## 2017-06-29 NOTE — ED Notes (Signed)
Called ICU and started 20 minute timer. Will take patient upstairs at 1350

## 2017-06-29 NOTE — ED Notes (Signed)
Pt very agitated, disoriented, and trying to get out of bed. This RN gave pt towels to fold to keep him occupied. Re-orientation attempts are continually being made. Byrum MD has been paged for further orders.

## 2017-06-29 NOTE — Progress Notes (Signed)
Pharmacy Antibiotic Note  Jordan DareDaniel D Hudson is a 31 y.o. male admitted on 06/29/2017 with sepsis.  Received Vancomycin 1gm and Zosyn 3.375gm IV x 1 dose each while in the ED.  Pharmacy has been consulted for Vancomycin and Zosyn dosing.  Plan:  Vancomycin 1gm IV q8h  Zosyn 3.375gm IV q8h (each dose infused over 4 hrs)  Check daily SCr while on Vancomycin and Zosyn  F/U cultures  Height: 5\' 8"  (172.7 cm) Weight: 145 lb (65.8 kg) IBW/kg (Calculated) : 68.4  Temp (24hrs), Avg:101.6 F (38.7 C), Min:100.1 F (37.8 C), Max:103.1 F (39.5 C)   Recent Labs Lab 06/29/17 0234 06/29/17 0242 06/29/17 0506  WBC 12.6*  --   --   CREATININE 0.73  --   --   LATICACIDVEN  --  1.96* 1.59    Estimated Creatinine Clearance: 124.5 mL/min (by C-G formula based on SCr of 0.73 mg/dL).    No Known Allergies  Antimicrobials this admission: 10/29 vanc >>   10/29 zosyn >>    Dose adjustments this admission:    Microbiology results: 10/29 BCx: sent  Thank you for allowing pharmacy to be a part of this patient's care.  Jordan PilePoindexter, Jordan Hudson, PharmD 06/29/2017 6:44 AM

## 2017-06-29 NOTE — ED Provider Notes (Signed)
[facility]-EMERGENCY DEPT Provider Note   CSN: [id number] Arrival date & time: 06/29/17  0211    History   Chief Complaint Chief Complaint  Patient presents with  . Fever  . Hallucinations    HPI Jordan Hudson is a 31 y.o. male.  31 year old male with a history of depression and hypertension presents to the emergency department for evaluation of cold symptoms and fever.  Patient states that he began with nasal congestion.  This has progressed to a cough as well as back pain.  He has been taking [employer] cold and flu medication with Tylenol over the past 3 days.  He states that he has had 1.5 bottles of this medication over the past 72 hours.  He report reports drinking a 24 ounce beer yesterday as well.  He usually drinks 48-72 ounces of beer daily.  He has a history of tremors and agitation from alcohol withdrawal.  Family reports auditory hallucinations this evening.  Patient noted to be febrile up to 103.21F in triage.  He reports marijuana use and denies illicit drug use. No known sick contacts.   The history is provided by the patient. No language interpreter was used.  Fever      Past Medical History:  Diagnosis Date  . Depression    8 months  . Hypertension     Patient Active Problem List   Diagnosis Date Noted  . Alcohol dependence with withdrawal, uncomplicated (HCC) 06/02/2014  . Substance induced mood disorder (HCC) 06/02/2014  . Chronic alcohol dependence, continuous (HCC) 12/03/2011    Class: Chronic  . PREMATURE EJACULATION 09/09/2010  . ELEVATED BLOOD PRESSURE 09/09/2010  . ALCOHOL ABUSE, HX OF 09/09/2010    History reviewed. No pertinent surgical history.     Home Medications    Prior to Admission medications   Medication Sig Start Date End Date Taking? Authorizing Provider  ibuprofen (ADVIL,MOTRIN) 200 MG tablet Take 400 mg by mouth every 6 (six) hours as needed for headache, mild pain or moderate pain.   Yes  [provider]  cloNIDine (CATAPRES) 0.1 MG tablet Take 1 tablet (0.1 mg total) by mouth 2 (two) times daily. Patient not taking: Reported on 06/29/2017 06/02/14   Charm Rings, NP    Family History Family History  Problem Relation Age of Onset  . Hypertension Mother   . Heart failure Father   . Diabetes Father   . Hypertension Brother     Social History Social History  Substance Use Topics  . Smoking status: Current Every Day Smoker    Packs/day: 0.50    Years: 10.00    Types: Cigarettes  . Smokeless tobacco: Never Used  . Alcohol use Yes     Comment: 2-3 beers on the weekend     Allergies   Patient has no known allergies.   Review of Systems Review of Systems  Constitutional: Positive for fever.  Ten systems reviewed and are negative for acute change, except as noted in the HPI.    Physical Exam Updated Vital Signs BP (!) 158/93   Pulse 95   Temp 100.1 F (37.8 C) (Oral)   Resp (!) 30   Ht 5\' 8"  (1.727 m)   Wt 65.8 kg (145 lb)   SpO2 100%   BMI 22.05 kg/m   Physical Exam  Constitutional: He is oriented to person, place, and time. He appears well-developed and well-nourished. No distress.  Mildly hyperactive. Patient in NAD.  HENT:  Head: Normocephalic  and atraumatic.  Cheilitis.  Dry mucous membranes.  Eyes: Conjunctivae and EOM are normal. No scleral icterus.  Neck: Normal range of motion.  No meningismus  Cardiovascular: Regular rhythm and intact distal pulses.   Tachycardia  Pulmonary/Chest: Effort normal. No respiratory distress. He has no wheezes. He has no rales.  Lungs CTAB. Dry cough with deep inspiration; nonproductive.  Abdominal: Soft. He exhibits no distension and no mass. There is no tenderness. There is no guarding.  Soft, nondistended, nontender.  Musculoskeletal: Normal range of motion.  Neurological: He is alert and oriented to person, place, and time. He exhibits normal muscle tone. Coordination normal.  Skin: Skin is  warm and dry. No rash noted. He is not diaphoretic. No erythema. No pallor.  Psychiatric: He has a normal mood and affect. His behavior is normal.  Nursing note and vitals reviewed.    ED Treatments / Results  Labs (all labs ordered are listed, but only abnormal results are displayed) Labs Reviewed  COMPREHENSIVE METABOLIC PANEL - Abnormal; Notable for the following:       Result Value   Sodium 133 (*)    Potassium 2.6 (*)    Chloride 90 (*)    Glucose, Bld 137 (*)    BUN <5 (*)    Albumin 3.4 (*)    ALT 11 (*)    Anion gap 17 (*)    All other components within normal limits  CBC WITH DIFFERENTIAL/PLATELET - Abnormal; Notable for the following:    WBC 12.6 (*)    RBC 3.60 (*)    Hemoglobin 12.7 (*)    HCT 35.1 (*)    MCH 35.3 (*)    MCHC 36.2 (*)    Neutro Abs 10.2 (*)    Monocytes Absolute 1.5 (*)    All other components within normal limits  URINALYSIS, ROUTINE W REFLEX MICROSCOPIC - Abnormal; Notable for the following:    Specific Gravity, Urine <1.005 (*)    All other components within normal limits  RAPID URINE DRUG SCREEN, HOSP PERFORMED - Abnormal; Notable for the following:    Tetrahydrocannabinol POSITIVE (*)    All other components within normal limits  ACETAMINOPHEN LEVEL - Abnormal; Notable for the following:    Acetaminophen (Tylenol), Serum <10 (*)    All other components within normal limits  MAGNESIUM - Abnormal; Notable for the following:    Magnesium 1.6 (*)    All other components within normal limits  I-STAT CG4 LACTIC ACID, ED - Abnormal; Notable for the following:    Lactic Acid, Venous 1.96 (*)    All other components within normal limits  CULTURE, BLOOD (ROUTINE X 2)  CULTURE, BLOOD (ROUTINE X 2)  INFLUENZA PANEL BY PCR (TYPE A & B)  ETHANOL  SALICYLATE LEVEL  I-STAT CG4 LACTIC ACID, ED    EKG  EKG Interpretation None       Radiology Dg Chest 2 View  Result Date: 06/29/2017 CLINICAL DATA:  Cough, chest pain, fever, shortness of  breath. Smoker. EXAM: CHEST  2 VIEW COMPARISON:  None. FINDINGS: Dense consolidation LEFT lower lobe. No pleural effusion. Cardiomediastinal silhouette is normal. No pneumothorax. Soft tissue planes and included osseous structures are normal. IMPRESSION: LEFT lower lobe pneumonia. Electronically Signed   By: Awilda Metro M.D.   On: 06/29/2017 03:54    Procedures Procedures (including critical care time)  Medications Ordered in ED Medications  potassium chloride 10 mEq in 100 mL IVPB (10 mEq Intravenous New Bag/Given 06/29/17 0412)  nicotine (  NICODERM CQ - dosed in mg/24 hours) patch 21 mg (21 mg Transdermal Patch Applied 06/29/17 0447)  dexmedetomidine (PRECEDEX) 200 MCG/50ML (4 mcg/mL) infusion (not administered)  sodium chloride 0.9 % bolus 1,000 mL (0 mLs Intravenous Stopped 06/29/17 0324)    And  sodium chloride 0.9 % bolus 1,000 mL (0 mLs Intravenous Stopped 06/29/17 0354)  piperacillin-tazobactam (ZOSYN) IVPB 3.375 g (0 g Intravenous Stopped 06/29/17 0352)  vancomycin (VANCOCIN) IVPB 1000 mg/200 mL premix (0 mg Intravenous Stopped 06/29/17 0452)  LORazepam (ATIVAN) injection 1 mg (1 mg Intravenous Given 06/29/17 0321)  acetaminophen (TYLENOL) tablet 1,000 mg (1,000 mg Oral Given 06/29/17 0321)  LORazepam (ATIVAN) injection 1 mg (1 mg Intravenous Given 06/29/17 0412)    CRITICAL CARE Performed by: Antony MaduraHUMES, Marcelino Campos   Total critical care time: 40 minutes  Critical care time was exclusive of separately billable procedures and treating other patients.  Critical care was necessary to treat or prevent imminent or life-threatening deterioration.  Critical care was time spent personally by me on the following activities: development of treatment plan with patient and/or surrogate as well as nursing, discussions with consultants, evaluation of patient's response to treatment, examination of patient, obtaining history from patient or surrogate, ordering and performing treatments and  interventions, ordering and review of laboratory studies, ordering and review of radiographic studies, pulse oximetry and re-evaluation of patient's condition.   Initial Impression / Assessment and Plan / ED Course  I have reviewed the triage vital signs and the nursing notes.  Pertinent labs & imaging results that were available during my care of the patient were reviewed by me and considered in my medical decision making (see chart for details).     293:3915 AM 31 year old male presents to the emergency department for hallucinations in setting of fever and upper respiratory symptoms.  Patient meeting Sirs criteria on arrival with fever and tachycardia.  Patient also hypertensive.  He has a history of chronic alcohol use, drinking approximately 72 ounces of beer per day.  He does report agitation and tremors if he does not drink alcohol.  He also has a history of hypertension.  This may be partially exacerbated by mild alcohol withdrawal.  Will give Ativan; CIWA 16 per RN. Labs pending for sepsis work up. Broad spectrum antibiotics ordered.  3:45 AM Per RN, CIWA now 12.  Patient had some improvement in his blood pressure with Ativan. Will give additional 1mg  Ativan and then HOLD all additional benzo's. HR also improving with IVF.  4:38 AM Patient reassessed.  Mild speech slurring after receiving Ativan.  Patient is also having active visual hallucinations.  Thought process is tangential.  I have explained diagnosis of pneumonia with mother at bedside who verbalizes understanding.  Plan to admit for management of CAP and sepsis with encephalopathy.  5:01 AM Case discussed with Dr. Katrinka BlazingSmith of [facility] regarding admission.  Hospitalist to see in the morning.  5:11 AM Per RN, patient increasingly agitated. CIWA calculated at 27 currently. There is concern for increasing doses of Ativan as this may result in eventual intubation and has seemed to only make the patient more agitated and encephalopathic.  Discussed case with Dr. Elesa MassedWard who recommended Precedex gtt. HR and BP stable currently; 95 and 158/93, respectively. Case discussed with Critical Care MD, Dr. Arsenio LoaderSommer. PCCM to see.  6:40 AM Admission orders placed by PCCM.   Final Clinical Impressions(s) / ED Diagnoses   Final diagnoses:  Sepsis, due to unspecified organism [facility](HCC)  Community acquired pneumonia of left lower  lobe of lung (HCC)  Acute encephalopathy  Hypokalemia    New Prescriptions New Prescriptions   No medications on file     Antony Madura, Cordelia Poche 06/29/17 1610    Ward, Layla Maw, DO 06/29/17 [phone]

## 2017-06-30 ENCOUNTER — Other Ambulatory Visit (HOSPITAL_COMMUNITY): Payer: Self-pay

## 2017-06-30 ENCOUNTER — Inpatient Hospital Stay (HOSPITAL_COMMUNITY): Payer: Self-pay

## 2017-06-30 LAB — BASIC METABOLIC PANEL
ANION GAP: 15 (ref 5–15)
ANION GAP: 12 (ref 5–15)
BUN: 7 mg/dL (ref 6–20)
BUN: 10 mg/dL (ref 6–20)
CALCIUM: 8.6 mg/dL — AB (ref 8.9–10.3)
CHLORIDE: 100 mmol/L — AB (ref 101–111)
CO2: 25 mmol/L (ref 22–32)
CO2: 25 mmol/L (ref 22–32)
Calcium: 8.9 mg/dL (ref 8.9–10.3)
Chloride: 100 mmol/L — ABNORMAL LOW (ref 101–111)
Creatinine, Ser: 0.86 mg/dL (ref 0.61–1.24)
Creatinine, Ser: 1.04 mg/dL (ref 0.61–1.24)
GFR calc Af Amer: >60 mL/min (ref >60)
GFR calc non Af Amer: >60 mL/min (ref >60)
Glucose, Bld: 82 mg/dL (ref 65–99)
Glucose, Bld: 93 mg/dL (ref 65–99)
POTASSIUM: 2.6 mmol/L — AB (ref 3.5–5.1)
POTASSIUM: 4.5 mmol/L (ref 3.5–5.1)
SODIUM: 140 mmol/L (ref 135–145)
Sodium: 137 mmol/L (ref 135–145)

## 2017-06-30 LAB — CBC
HCT: 33.3 % — ABNORMAL LOW (ref 39.0–52.0)
HEMOGLOBIN: 12 g/dL — AB (ref 13.0–17.0)
MCH: 35.7 pg — ABNORMAL HIGH (ref 26.0–34.0)
MCHC: 36 g/dL (ref 30.0–36.0)
MCV: 99.1 fL (ref 78.0–100.0)
Platelets: 334 10*3/uL (ref 150–400)
RBC: 3.36 MIL/uL — AB (ref 4.22–5.81)
RDW: 14.3 % (ref 11.5–15.5)
WBC: 12.4 10*3/uL — AB (ref 4.0–10.5)

## 2017-06-30 LAB — HIV ANTIBODY (ROUTINE TESTING W REFLEX): HIV SCREEN 4TH GENERATION: NONREACTIVE

## 2017-06-30 LAB — PROCALCITONIN: Procalcitonin: 7.09 ng/mL

## 2017-06-30 LAB — PHOSPHORUS: PHOSPHORUS: 3.9 mg/dL (ref 2.5–4.6)

## 2017-06-30 MED ORDER — POTASSIUM CHLORIDE 10 MEQ/100ML IV SOLN
10.0000 meq | INTRAVENOUS | Status: DC
  Administered 2017-06-30 (×3): 10 meq via INTRAVENOUS
  Filled 2017-06-30 (×3): qty 100

## 2017-06-30 MED ORDER — POTASSIUM CHLORIDE CRYS ER 20 MEQ PO TBCR
40.0000 meq | EXTENDED_RELEASE_TABLET | ORAL | Status: AC
  Administered 2017-06-30 (×2): 40 meq via ORAL
  Filled 2017-06-30 (×2): qty 2

## 2017-06-30 MED ORDER — LIP MEDEX EX OINT
TOPICAL_OINTMENT | CUTANEOUS | Status: AC
  Administered 2017-06-30: 1
  Filled 2017-06-30: qty 7

## 2017-06-30 NOTE — Care Management Note (Signed)
Case Management Note  Patient Details  Name: Jordan Hudson MRN: [id number] Date of Birth: 05/08/1986  Subjective/Objective:                 Fever, elevated wbc and positive bld cultures  Action/Plan:  Date:  June 30, 2017 Chart reviewed for concurrent status and case management needs.  Will continue to follow patient progress.  Discharge Planning: following for needs  Expected discharge date: [id number]  Marcelle SmilingRhonda Teana Lindahl, BSN, [place]RN3, [place]CCM   [phone]   Expected Discharge Date:   (unknown)               Expected Discharge Plan:  Home/Self Care  In-House Referral:     Discharge planning Services  CM Consult  Post Acute Care Choice:    Choice offered to:     DME Arranged:    DME Agency:     HH Arranged:    HH Agency:     Status of Service:  In process, will continue to follow  If discussed at Long Length of Stay Meetings, dates discussed:    Additional Comments:  Golda AcreDavis, Ilijah Doucet Lynn, RN 06/30/2017, 8:29 AM

## 2017-06-30 NOTE — Progress Notes (Addendum)
       Interval history/subjective  Resting in bed Denies anxiety or hallucinations but mother tells us he is dreaming and "bringing up traumatic issues from past".   BP (!) 152/85   Pulse 62   Temp 98.5 F (36.9 C) (Oral)   Resp (!) 24   Ht 5\' 8"  (1.727 m)   Wt 138 lb 7.2 oz (62.8 kg)   SpO2 97%   BMI 21.05 kg/m     Intake/Output Summary (Last 24 hours) at 06/30/17 1441 Last data filed at 06/30/17 [id number]  Gross per 24 hour  Intake           453.79 ml  Output             2375 ml  Net         -1921.21 ml   Room air   Physical exam General: 31 year old aam, resting in bed, appropriate and cooperative  HEENT:NCAT, MMM but lips cracked.  Pulm: decreased Left base. No accessory use  Card: RRR w/out MRG Extremities/MS:warm and dry. No edema.  Neuro/psych awake and alert cooperative and appropriate  Lab data   Recent Labs Lab 06/29/17 0234 06/29/17 1544 06/30/17 0333  NA 133* 137 140  K 2.6* 3.0* 2.6*  CL 90* 101 100*  CO2 26 23 25   BUN <5* <5* 7  CREATININE 0.73 0.70 0.86  GLUCOSE 137* 85 82    Recent Labs Lab 06/29/17 0234 06/30/17 0333  HGB 12.7* 12.0*  HCT 35.1* 33.3*  WBC 12.6* 12.4*  PLT 283 334   ABG No results found for: PHART, PCO2ART, PO2ART, HCO3, TCO2, ACIDBASEDEF, O2SAT  Impression Pneumococcal PNA and bacteremia ETOH w/d -->improved.  HTN Hypokalemia ->replaced   Discussion Doing well w/ what seems to be excellent clinical response thus far. Biggest concern at this point would be to watch for development of pleural effusion (which would require drainage in this setting). Also need to screen for endocarditis (although in his age group looking at incidence of about 4 in 100,000).  Plan.  Cont IV rocephin  Repeat BCX2 Repeat 2 view CXR in am (we will follow this and make appropriate recs) Awaiting repeat BMET for this afternoon Cont supportive care re: w/d. Has been off precedex since this am Mother asking for resources for out-pt in  regards to stress and trauma in past. ? Contributing to his substance abuse??-->will ask social work to provide resources.  Will ask [facility] to assume primary care  Simonne MartinetPeter E Norie Latendresse ACNP-BC [facility]Critical [facility] Pager # [phone] OR # [phone] if no answer

## 2017-06-30 NOTE — Progress Notes (Signed)
eLink Physician-Brief Progress Note Patient Name: Jordan DareDaniel D Pablo DOB: 02/08/1986 MRN: [id number]   Date of Service  06/30/2017  HPI/Events of Note  K+ = 2.6 and Creatinine = 0.86.  eICU Interventions  Will replace K+.      Intervention Category Major Interventions: Electrolyte abnormality - evaluation and management  Sommer,Steven Eugene 06/30/2017, 5:26 AM

## 2017-06-30 NOTE — Progress Notes (Signed)
PT demonstrated verbal and hands on understanding of Flutter device. 

## 2017-06-30 NOTE — Progress Notes (Addendum)
.. ..  Name: Jordan Hudson MRN: [id number] DOB: 10/03/1985    ADMISSION DATE:  06/29/2017 CONSULTATION DATE: 06/29/17  REFERRING MD :  ER PHYSICIAN  CHIEF COMPLAINT:  Per Triage notes: Pt reported having fever and cold symptoms for the last several days and states that he may have [facility] at 1600 on 06/28/17. Pt states he has been also drinking beer and has been hearing voices from phone app  BRIEF PATIENT DESCRIPTION: 31 yr old male with PMHx of ETOH abuse (multiple admissions in the past dating back to 2012/2013) for detox, presents today with fever and cold symptoms. He reports trying to treat these symptoms with OTC Acetaminophen. His last drink was within last 24 hrs however on average he drinks 48-72 ounces of beer and he only reports drinking 24 oz of beer on 10/28. Concern for active ETOH withdrawal. PCCM consulted  SIGNIFICANT EVENTS  Hallucinations, elevated CIWA, Fever  STUDIES:  CXR - LLL consolidation suggestive of PNA Alcohol Ethyl level <10 Salicylate level <7 +THC on UDS    SUBJECTIVE:  Remained on Precedex overnight, had transient bradycardia but otherwise stable.  Blood cultures coming back positive with strep pneumoniae  VITAL SIGNS: Temp:  [98.4 F (36.9 C)-99.5 F (37.5 C)] 98.4 F (36.9 C) (10/30 0320) Pulse Rate:  [52-99] 52 (10/30 0700) Resp:  [16-37] 24 (10/30 0700) BP: (132-218)/(82-147) 191/126 (10/30 0740) SpO2:  [89 %-100 %] 97 % (10/30 0700) Weight:  [138 lb 7.2 oz (62.8 kg)-141 lb 1.5 oz (64 kg)] 138 lb 7.2 oz (62.8 kg) (10/30 0500) Room air PHYSICAL EXAMINATION: General: This is a 31 year old African-American male currently resting comfortably in bed with his family at the bedside.  He is in no distress.  Currently off Precedex infusion. HEENT: Normocephalic atraumatic.  His mucous membranes are moist there is no jugular venous distention Pulmonary: No accessory muscle use breathing  comfortably on room air, rhonchorous breath sounds on the left.  He has no pleuritic chest discomfort Cardiac: Regular rate and rhythm bradycardic on telemetry monitoring Abdomen: Soft nontender no nausea or vomiting.  No organomegaly good bowel sounds. Extremities/musculoskeletal: Equal strength and bulk skin is warm brisk cap refill strong pulses no edema Neuro: Awake alert cooperative moves all extremities no focal deficits  Recent Labs Lab 06/29/17 0234 06/29/17 1544 06/30/17 0333  NA 133* 137 140  K 2.6* 3.0* 2.6*  CL 90* 101 100*  CO2 26 23 25   BUN <5* <5* 7  CREATININE 0.73 0.70 0.86  GLUCOSE 137* 85 82    Recent Labs Lab 06/29/17 0234 06/30/17 0333  HGB 12.7* 12.0*  HCT 35.1* 33.3*  WBC 12.6* 12.4*  PLT 283 334   Dg Chest 2 View  Result Date: 06/29/2017 CLINICAL DATA:  Cough, chest pain, fever, shortness of breath. Smoker. EXAM: CHEST  2 VIEW COMPARISON:  None. FINDINGS: Dense consolidation LEFT lower lobe. No pleural effusion. Cardiomediastinal silhouette is normal. No pneumothorax. Soft tissue planes and included osseous structures are normal. IMPRESSION: LEFT lower lobe pneumonia. Electronically Signed   By: Awilda Metroourtnay  Bloomer M.D.   On: 06/29/2017 03:54    ASSESSMENT / PLAN:  Left lower lobe strep pneumonia with bacteremia -Hemodynamically and clinically stable -Chest x-ray personally reviewed this demonstrates persistent left lower lobe airspace disease without significant change when compared to admission film Plan ceftriaxone and vancomycin, this is now day #2; await sensitivities but ok to stop vanc Trend procalcitonin algorithm Trend fever and  white blood cell curve Repeat x-ray in a.m. Add flutter valve 2D echocardiogram looking for vegetation  Acute encephalopathy in the setting of delirium tremens -Tolerating Precedex well Plan Continue Precedex infusion for RASS goal 0 Add multivitamin scratch that continue multivitamin Continue folic  acid Frequent redirection, encourage normal sleep wake cycle  Hypertension, likely exacerbated by acute withdrawal Plan Supportive care Continue telemetry monitoring Catapres patch As needed Apresoline  Fluid and electrolyte disorder: Hypokalemia as well as hypomagnesemia Plan Replace and recheck   DVT prophylaxis: Enoxaparin SUP: Pantoprazole Diet: Advance diet as tolerated Activity: Out of bed with assistance Disposition : Stepdown status once off Precedex  Summary/discussion Clinically some improved.  Continue antibiotics wean Precedex stepdown status soon  My critical care time 31 minutes Simonne Martinet ACNP-BC [facility] Pulmonary/Critical Care Pager # [phone] OR # [phone] if no answer 06/30/2017, 8:15 AM  Attending Note:  I have examined patient, reviewed labs, studies and notes. I have discussed the case with Kreg Shropshire, and I agree with the data and plans as amended above.   31 year old man history of alcohol abuse, hypertension admitted with upper respiratory symptoms, cough.  Found to have a left lower lobe infiltrate consistent with a community-acquired pneumonia.  He developed withdrawal symptoms, hallucinations, delirium that persisted despite benzodiazepines.  He was started on Precedex and moved to the intensive care unit.  Blood culture positive for pneumococcus.  He has experienced hypertension overnight, received hydralazine, nitroglycerin paste, clonidine patch.  Vitals:   06/30/17 0600 06/30/17 0700 06/30/17 0740 06/30/17 0800  BP: (!) 179/113 (!) 218/113 (!) 191/126 (!) 152/85  Pulse: (!) 54 (!) 52  62  Resp: (!) 25 (!) 24    Temp:    98 F (36.7 C)  TempSrc:    Oral  SpO2: 92% 97%    Weight:      Height:       On my evaluation this morning he, oriented, appropriate.  He does not have any resting or intentional tremor.  Denies any hallucinations.  Has coarse left-sided breath sounds, right side clear.  Heart regular with borderline tachycardia, no  murmur.  Abdomen benign soft with positive bowel sounds.  No significant lower extremity edema.  We will attempt to manage withdrawal symptoms with intermittent lorazepam, wean the Precedex drip to off if possible.  Change antibiotics to ceftriaxone.  Follow chest x-ray and respiratory status.  Start oral blood pressure control when he is able to take p.o.  Suspect we will be able to transition to stepdown 10/30.   Independent critical care time is 32 minutes.   Levy Pupa, MD, PhD 06/30/2017, 11:06 AM [facility] [phone] or if no answer [phone]

## 2017-07-01 ENCOUNTER — Inpatient Hospital Stay (HOSPITAL_COMMUNITY): Payer: Self-pay

## 2017-07-01 DIAGNOSIS — R0600 Dyspnea, unspecified: Secondary | ICD-10-CM

## 2017-07-01 DIAGNOSIS — G9340 Encephalopathy, unspecified: Secondary | ICD-10-CM

## 2017-07-01 LAB — ECHOCARDIOGRAM COMPLETE
HEIGHTINCHES: 68 in
WEIGHTICAEL: 2186.96 [oz_av]

## 2017-07-01 LAB — CBC
HEMATOCRIT: 32.3 % — AB (ref 39.0–52.0)
HEMOGLOBIN: 11.6 g/dL — AB (ref 13.0–17.0)
MCH: 35.3 pg — ABNORMAL HIGH (ref 26.0–34.0)
MCHC: 35.9 g/dL (ref 30.0–36.0)
MCV: 98.2 fL (ref 78.0–100.0)
Platelets: 448 10*3/uL — ABNORMAL HIGH (ref 150–400)
RBC: 3.29 MIL/uL — AB (ref 4.22–5.81)
RDW: 14.6 % (ref 11.5–15.5)
WBC: 9.8 10*3/uL (ref 4.0–10.5)

## 2017-07-01 LAB — CULTURE, BLOOD (ROUTINE X 2)
SPECIAL REQUESTS: ADEQUATE
Special Requests: ADEQUATE

## 2017-07-01 LAB — MAGNESIUM: MAGNESIUM: 1.9 mg/dL (ref 1.7–2.4)

## 2017-07-01 LAB — PROCALCITONIN: Procalcitonin: 5.28 ng/mL

## 2017-07-01 MED ORDER — HYDRALAZINE HCL 25 MG PO TABS
25.0000 mg | ORAL_TABLET | Freq: Four times a day (QID) | ORAL | Status: DC | PRN
  Administered 2017-07-01: 25 mg via ORAL
  Filled 2017-07-01: qty 1

## 2017-07-01 MED ORDER — CLONIDINE HCL 0.1 MG PO TABS
0.1000 mg | ORAL_TABLET | Freq: Two times a day (BID) | ORAL | Status: DC
  Administered 2017-07-01 – 2017-07-02 (×3): 0.1 mg via ORAL
  Filled 2017-07-01 (×3): qty 1

## 2017-07-01 MED ORDER — AMLODIPINE BESYLATE 10 MG PO TABS
10.0000 mg | ORAL_TABLET | Freq: Every day | ORAL | Status: DC
  Administered 2017-07-01 – 2017-07-02 (×2): 10 mg via ORAL
  Filled 2017-07-01 (×2): qty 1

## 2017-07-01 NOTE — Clinical Social Work Note (Signed)
Clinical Social Work Assessment  Patient Details  Name: Jordan Hudson MRN: [id number] Date of Birth: 01-23-1986  Date of referral:  07/01/17               Reason for consult:  Discharge Planning, Family Concerns, Mental Health Concerns, Emotional/Coping/Adjustment to Illness, Catering manager sought to share information with:  Case Production designer, theatre/television/film, Family Supports Permission granted to share information::  Yes, Verbal Permission Granted  Name::        Agency::     Relationship::  Mother  Contact Information:     Housing/Transportation Living arrangements for the past 2 months:  Apartment Source of Information:  Patient, Medical Team, Sports coach, Parent Patient Interpreter Needed:  None Criminal Activity/Legal Involvement Pertinent to Current Situation/Hospitalization:  No - Comment as needed Significant Relationships:  Other Family Members, Parents Lives with:  Parents Do you feel safe going back to the place where you live?  Yes Need for family participation in patient care:  Yes (Comment)  Care giving concerns:  Patient admitted to ICU at [facility] due to alochol withdrawal and Left lower lobe strep pneumonia with bacteremia. Patient reports prior to admission he was living with his mother who is his strongest support system. Patient reports and gives permission to speak with mother.  He is currently not working nor has insurance, but was independent with ADLs. NO concerns noted by patient.  Mother had concerns with triggerd LCSW consult to provide resources for patient as he has a "traumatic past".  Patient declined to give information regarding his past, but he was open to receiving education and information regarding community resources for therapy and medication.  He declined SA resources at this time, but providers listed for patient can assist with substance abuse.   Social Worker assessment / plan:  Consult: "per mother's request for community resources  related to his traumatic past". Patient seen at bedside, alert and oriented x4 and very pleasant. Patient reports his behaviors during inpatient are not his baseline and he apologized for what occurred while here in ICU.  He is validated in his emotional response and receptive to all information and interventions.  Mother not present for assessment, however called Jordan Hudson however number in chart rings to the wrong individual.  LCSW placed all information on AVS for patient and discussed Vesta Mixer, [facility] [place] and obtaining patient a PCP as he will require follow up and medications at DC. CM called regarding PCP. Patient declined appointments for mental health, thus given information to follow up on his own.  RN updated regarding plans and interventions discussed with patient.  Plan: Patient will return home with his mother and she will provide transportation.   AVS supports MH referrals and CM to follow up with PCP appointment.   Employment status:  Unemployed Health and safety inspector:  Self Pay (Medicaid Pending) PT Recommendations:  Not assessed at this time Information / Referral to community resources:  Outpatient Substance Abuse Treatment Options, Outpatient Psychiatric Care (Comment Required)  Patient/Family's Response to care:  Patient voices understanding and is aware of how to reach LCSW in the event he would like additional resources or needs.  Patient/Family's Understanding of and Emotional Response to Diagnosis, Current Treatment, and Prognosis:  Patient shows understanding regarding his reason to admit and behaviors. He lacks ability to discuss current triggers for continued alcohol dependence, however he is receptive and thinking of getting  into therapy AEB accepting referrals.   Emotional Assessment Appearance:  Appears stated age Attitude/Demeanor/Rapport:    Affect (typically observed):  Accepting, Adaptable, Pleasant Orientation:  Oriented to Self,  Oriented to Place, Oriented to  Time, Oriented to Situation Alcohol / Substance use:  Alcohol Use Psych involvement (Current and /or in the community):  No (Comment)  Discharge Needs  Concerns to be addressed:  Denies Needs/Concerns at this time Readmission within the last 30 days:  No Current discharge risk:  Substance Abuse Barriers to Discharge:  Barriers Resolved, Continued Medical Work up   Raye SorrowCoble, Sila Sarsfield N, LCSW 07/01/2017, 1:47 PM

## 2017-07-01 NOTE — Progress Notes (Signed)
.. ..    Name: Jordan Hudson MRN: [id number] DOB: 12/17/1985    ADMISSION DATE:  06/29/2017 CONSULTATION DATE: 06/29/17  REFERRING MD :  ER PHYSICIAN  CHIEF COMPLAINT:  Per Triage notes: Pt reported having fever and cold symptoms for the last several days and states that he may have took to much of the [facility] Flu [facility] at 1600 on 06/28/17. Pt states he has been also drinking beer and has been hearing voices from phone app  BRIEF PATIENT DESCRIPTION: 31 yr old male with PMHx of ETOH abuse (multiple admissions in the past dating back to 2012/2013) for detox, presents today with fever and cold symptoms. He reports trying to treat these symptoms with OTC Acetaminophen. His last drink was within last 24 hrs however on average he drinks 48-72 ounces of beer and he only reports drinking 24 oz of beer on 10/28. Concern for active ETOH withdrawal. PCCM consulted  SIGNIFICANT EVENTS  Hallucinations, elevated CIWA, Fever  STUDIES:  CXR - LLL consolidation suggestive of PNA Alcohol Ethyl level <10 Salicylate level <7 +THC on UDS   SUBJECTIVE:  Feeling better, no distress VITAL SIGNS: Temp:  [98.5 F (36.9 C)-99.9 F (37.7 C)] 98.9 F (37.2 C) (10/31 0338) Pulse Rate:  [54-106] 69 (10/31 0800) Resp:  [16-36] 31 (10/31 0800) BP: (122-173)/(59-112) 173/108 (10/31 0800) SpO2:  [90 %-99 %] 96 % (10/31 0800) Weight:  [136 lb 11 oz (62 kg)] 136 lb 11 oz (62 kg) (10/31 0439) Room air PHYSICAL EXAMINATION: HEENT: Normocephalic atraumatic mucous membranes are moist no jugular venous distention Pulmonary: Equal chest rise, no accessory muscle use, rhonchorous breath sounds left greater than right denies chest pain Cardiac: Regular rate and rhythm without murmur rub or gallop Abdomen: Soft, nontender, good bowel sounds, no organomegaly Extremities/musculoskeletal no edema, brisk capillary refill, warm and dry Neuro: Awake oriented appropriate and cooperative no focal motor  deficits.  Recent Labs Lab 06/29/17 1544 06/30/17 0333 06/30/17 1455  NA 137 140 137  K 3.0* 2.6* 4.5  CL 101 100* 100*  CO2 23 25 25   BUN <5* 7 10  CREATININE 0.70 0.86 1.04  GLUCOSE 85 82 93    Recent Labs Lab 06/29/17 0234 06/30/17 0333 07/01/17 0601  HGB 12.7* 12.0* 11.6*  HCT 35.1* 33.3* 32.3*  WBC 12.6* 12.4* 9.8  PLT 283 334 448*   Dg Chest Port 1 View  Result Date: 06/30/2017 CLINICAL DATA:  Pneumonia EXAM: PORTABLE CHEST 1 VIEW COMPARISON:  06/29/2017 FINDINGS: Left lower lobe pneumonia is improved. Significant consolidation at the left base persists. Right lung is clear. Left upper lobe is clear. Normal heart size. No pneumothorax. IMPRESSION: Improving left lower lobe pneumonia. Significant consolidation persists. Continued follow-up until resolution is recommended. Electronically Signed   By: Jolaine ClickArthur  Hoss M.D.   On: 06/30/2017 08:46    ASSESSMENT / PLAN:  Left lower lobe strep pneumonia with bacteremia ETOH abuse Resolved delirium/DTs  -Echo: (still pending) -Afebrile -CXR (PA/LAT):   Discussion:  Plan Day # 3 abx; (switched to mono-therapy w/ rocephin on 10/30) ->narrow when susceptibilities available  F/u 2-view CXR F/u echo   Simonne MartinetPeter E Mclain Freer ACNP-BC [facility] Pul[facility]Critical [facility] Pager # [phone] OR # [phone] if no answer

## 2017-07-01 NOTE — Progress Notes (Signed)
[facility]  PROGRESS NOTE  Jordan Hudson VHQ:[id number] DOB: [birth year] DOA: 06/29/2017 PCP: Patient, No Pcp Per   Brief HPI:   31 year old male with a history of alcohol abuse, hypertension, admitted with left lower lobe community-acquired pneumonia.  Patient also has history of alcohol abuse and hospital course was complicated by alcohol withdrawal delirium.  Delirium significantly improved with Precedex.  He is currently off Precedex.  [facility] hospital[facility] resumed care on 07/01/2017.    Subjective   This morning patient feels better.  Still coughing up yellow phlegm.  Blood cultures growing strep pneumoniae.   Assessment/Plan:     1. Left lower lobe pneumonia-patient has left lower lobe pneumonia.  Blood cultures growing strep pneumonia.  Which is sensitive to ceftriaxone and Levaquin.  Continue ceftriaxone at this time.  Will switch to Levaquin in a.m. 2. Hypertension-blood pressure is elevated, patient's home medication listed Catapres 0.1 mg p.o. twice daily, will discontinue Catapres patch and restart Catapres 0.1 p.o. twice daily.  Also add amlodipine 10 mg p.o. Daily.  Continue hydralazine 25 mg p.o. every 6 hours as needed for BP more than 160/100. 3. Alcohol withdrawal delirium-resolved-continue Ativan as needed.  Folate, thiamine    DVT prophylaxis: Lovenox  Code Status: Full code l.  Family Communication: Discussed with patient  Aunt at bedside  Disposition Plan: likely home in 2 days   Consultants:   CCM  Procedures:  None  Continuous infusions . sodium chloride    . cefTRIAXone (ROCEPHIN)  IV Stopped (06/30/17 2202)  . dexmedetomidine (PRECEDEX) IV infusion Stopped (06/30/17 0744)      Antibiotics:   Anti-infectives    Start     Dose/Rate Route Frequency Ordered Stop   06/29/17 2345  cefTRIAXone (ROCEPHIN) 2 g in dextrose 5 % 50 mL IVPB     2 g 100 mL/hr over 30 Minutes Intravenous Daily at bedtime 06/29/17 2344     06/29/17 1200   vancomycin (VANCOCIN) IVPB 1000 mg/200 mL premix  Status:  Discontinued     1,000 mg 200 mL/hr over 60 Minutes Intravenous Every 8 hours 06/29/17 0643 06/30/17 0908   06/29/17 1000  piperacillin-tazobactam (ZOSYN) IVPB 3.375 g  Status:  Discontinued     3.375 g 12.5 mL/hr over 240 Minutes Intravenous Every 8 hours 06/29/17 0643 06/29/17 2345   06/29/17 0245  piperacillin-tazobactam (ZOSYN) IVPB 3.375 g     3.375 g 100 mL/hr over 30 Minutes Intravenous  Once 06/29/17 0237 06/29/17 0352   06/29/17 0245  vancomycin (VANCOCIN) IVPB 1000 mg/200 mL premix     1,000 mg 200 mL/hr over 60 Minutes Intravenous  Once 06/29/17 0237 06/29/17 0452       Objective   Vitals:   07/01/17 0700 07/01/17 0800 07/01/17 1100 07/01/17 1200  BP: (!) 163/94 (!) 173/108 (!) 154/106 (!) 140/94  Pulse: (!) 54 69    Resp: (!) 24 (!) 31 (!) 24 (!) 29  Temp:  99 F (37.2 C)  99.1 F (37.3 C)  TempSrc:  Oral  Oral  SpO2: 95% 96% 96%   Weight:      Height:        Intake/Output Summary (Last 24 hours) at 07/01/17 1456 Last data filed at 07/01/17 1300  Gross per 24 hour  Intake              240 ml  Output                0 ml  Net  240 ml   Filed Weights   06/29/17 1400 06/30/17 0500 07/01/17 0439  Weight: 64 kg (141 lb 1.5 oz) 62.8 kg (138 lb 7.2 oz) 62 kg (136 lb 11 oz)     Physical Examination:   General: Appears in no acute distress Eyes: No icterus, extraocular muscles intact  Mouth: Oral mucosa is moist, no lesions on palate,  Neck: Supple, no deformities, masses, or tenderness Lungs: Normal respiratory effort, rhonchi auscultated in the left lung base is Heart: Regular rate and rhythm, S1 and S2 normal, no murmurs, rubs auscultated Abdomen: BS normoactive,soft,nondistended,non-tender to palpation,no organomegaly Extremities: No pretibial edema, no erythema, no cyanosis, no clubbing Neuro : Alert and oriented to time, place and person, No focal deficits  Skin: No rashes seen on  exam    Data Reviewed: I have personally reviewed following labs and imaging studies  CBG:  CBC:  Recent Labs Lab 06/29/17 0234 06/30/17 0333 07/01/17 0601  WBC 12.6* 12.4* 9.8  NEUTROABS 10.2*  --   --   HGB 12.7* 12.0* 11.6*  HCT 35.1* 33.3* 32.3*  MCV 97.5 99.1 98.2  PLT 283 334 448*    Basic Metabolic Panel:  Recent Labs Lab 06/29/17 0234 06/29/17 1544 06/30/17 0333 06/30/17 1455 07/01/17 0601  NA 133* 137 140 137  --   K 2.6* 3.0* 2.6* 4.5  --   CL 90* 101 100* 100*  --   CO2 26 23 25 25   --   GLUCOSE 137* 85 82 93  --   BUN <5* <5* 7 10  --   CREATININE 0.73 0.70 0.86 1.04  --   CALCIUM 9.0 8.5* 8.9 8.6*  --   MG 1.6*  --   --   --  1.9  PHOS  --   --  3.9  --   --     Recent Results (from the past 240 hour(s))  Blood culture (routine x 2)     Status: Abnormal   Collection Time: 06/29/17  2:37 AM  Result Value Ref Range Status   Specimen Description BLOOD RIGHT ARM  Final   Special Requests   Final    BOTTLES DRAWN AEROBIC AND ANAEROBIC Blood Culture adequate volume   Culture  Setup Time   Final    GRAM POSITIVE COCCI IN PAIRS IN BOTH AEROBIC AND ANAEROBIC BOTTLES CRITICAL RESULT CALLED TO, READ BACK BY AND VERIFIED WITH: B GREEN PHARMD 2328 06/29/17 A BROWNING    Culture (A)  Final    STREPTOCOCCUS PNEUMONIAE SUSCEPTIBILITIES PERFORMED ON PREVIOUS CULTURE WITHIN THE LAST 5 DAYS. Performed at [facility], [address] [place], [place] [address]    Report Status 07/01/2017 FINAL  Final  Blood culture (routine x 2)     Status: Abnormal   Collection Time: 06/29/17  3:00 AM  Result Value Ref Range Status   Specimen Description BLOOD LEFT ANTECUBITAL  Final   Special Requests   Final    BOTTLES DRAWN AEROBIC AND ANAEROBIC Blood Culture adequate volume   Culture  Setup Time   Final    GRAM POSITIVE COCCI IN PAIRS IN BOTH AEROBIC AND ANAEROBIC BOTTLES CRITICAL RESULT CALLED TO, READ BACK BY AND VERIFIED WITH: B GREEN PHARMD 2328 06/29/17  A BROWNING Performed at [facility], [address] [place], [place] [address]    Culture STREPTOCOCCUS PNEUMONIAE (A)  Final   Report Status 07/01/2017 FINAL  Final   Organism ID, Bacteria STREPTOCOCCUS PNEUMONIAE  Final  Susceptibility   Streptococcus pneumoniae - MIC*    ERYTHROMYCIN <=0.12 SENSITIVE Sensitive     LEVOFLOXACIN 1 SENSITIVE Sensitive     PENICILLIN (non-meningitis) <=0.06 SENSITIVE Sensitive     CEFTRIAXONE (non-meningitis) <=0.12 SENSITIVE Sensitive     * STREPTOCOCCUS PNEUMONIAE  Blood Culture ID Panel (Reflexed)     Status: Abnormal   Collection Time: 06/29/17  3:00 AM  Result Value Ref Range Status   Enterococcus species NOT DETECTED NOT DETECTED Final   Listeria monocytogenes NOT DETECTED NOT DETECTED Final   Staphylococcus species NOT DETECTED NOT DETECTED Final   Staphylococcus aureus NOT DETECTED NOT DETECTED Final   Streptococcus species DETECTED (A) NOT DETECTED Final    Comment: CRITICAL RESULT CALLED TO, READ BACK BY AND VERIFIED WITH: B GREEN PHARMD 2328 06/29/17 A BROWNING    Streptococcus agalactiae NOT DETECTED NOT DETECTED Final   Streptococcus pneumoniae DETECTED (A) NOT DETECTED Final    Comment: CRITICAL RESULT CALLED TO, READ BACK BY AND VERIFIED WITH: B GREEN PHARMD 2328 06/29/17 A BROWNING    Streptococcus pyogenes NOT DETECTED NOT DETECTED Final   Acinetobacter baumannii NOT DETECTED NOT DETECTED Final   Enterobacteriaceae species NOT DETECTED NOT DETECTED Final   Enterobacter cloacae complex NOT DETECTED NOT DETECTED Final   Escherichia coli NOT DETECTED NOT DETECTED Final   Klebsiella oxytoca NOT DETECTED NOT DETECTED Final   Klebsiella pneumoniae NOT DETECTED NOT DETECTED Final   Proteus species NOT DETECTED NOT DETECTED Final   Serratia marcescens NOT DETECTED NOT DETECTED Final   Haemophilus influenzae NOT DETECTED NOT DETECTED Final   Neisseria meningitidis NOT DETECTED NOT DETECTED Final   Pseudomonas aeruginosa NOT  DETECTED NOT DETECTED Final   Candida albicans NOT DETECTED NOT DETECTED Final   Candida glabrata NOT DETECTED NOT DETECTED Final   Candida krusei NOT DETECTED NOT DETECTED Final   Candida parapsilosis NOT DETECTED NOT DETECTED Final   Candida tropicalis NOT DETECTED NOT DETECTED Final    Comment: Performed at [facility], [address], [place], [place] [address]  MRSA PCR Screening     Status: None   Collection Time: 06/29/17  2:48 PM  Result Value Ref Range Status   MRSA by PCR NEGATIVE NEGATIVE Final    Comment:        The GeneXpert MRSA Assay (FDA approved for NASAL specimens only), is one component of a comprehensive MRSA colonization surveillance program. It is not intended to diagnose MRSA infection nor to guide or monitor treatment for MRSA infections.      Liver Function Tests:  Recent Labs Lab 06/29/17 0234  AST 20  ALT 11*  ALKPHOS 78  BILITOT 0.8  PROT 8.1  ALBUMIN 3.4*   No results for input(s): LIPASE, AMYLASE in the last 168 hours. No results for input(s): AMMONIA in the last 168 hours.  Cardiac Enzymes: No results for input(s): CKTOTAL, CKMB, CKMBINDEX, TROPONINI in the last 168 hours. BNP (last 3 results) No results for input(s): BNP in the last 8760 hours.  ProBNP (last 3 results) No results for input(s): PROBNP in the last 8760 hours.    Studies: Dg Chest 2 View  Result Date: 07/01/2017 CLINICAL DATA:  Follow-up of pneumonia EXAM: CHEST  2 VIEW COMPARISON:  Chest x-ray of June 30, 2017 FINDINGS: There is persistent left lower lobe infiltrate. A small amount of pleural fluid on the left is suspected. There remainder of the left lung is clear as is the right lung. The heart and mediastinal structures  are normal. The bony thorax is unremarkable. IMPRESSION: Persistent left lower lobe pneumonia. There has been some improvement since yesterday's study. Electronically Signed   By: David  SwazilandJordan M.D.   On: 07/01/2017 09:39   Dg Chest Port  1 View  Result Date: 06/30/2017 CLINICAL DATA:  Pneumonia EXAM: PORTABLE CHEST 1 VIEW COMPARISON:  06/29/2017 FINDINGS: Left lower lobe pneumonia is improved. Significant consolidation at the left base persists. Right lung is clear. Left upper lobe is clear. Normal heart size. No pneumothorax. IMPRESSION: Improving left lower lobe pneumonia. Significant consolidation persists. Continued follow-up until resolution is recommended. Electronically Signed   By: Jolaine ClickArthur  Hoss M.D.   On: 06/30/2017 08:46    Scheduled Meds: . amLODipine  10 mg Oral Daily  . cloNIDine  0.1 mg Oral BID  . enoxaparin (LOVENOX) injection  40 mg Subcutaneous Q24H  . folic acid  1 mg Oral Daily  . multivitamin with minerals  1 tablet Oral Daily  . pantoprazole (PROTONIX) IV  40 mg Intravenous QHS  . potassium & sodium phosphates  1 packet Oral TID WC & HS  . thiamine  100 mg Oral Daily      Time spent: 20 min  [facility],[place] K[address] S   [facility] Pager [phone]. If 7PM-7AM, please contact night-coverage at [email or web address], Office  [phone]  password [email or web address]  07/01/2017, 2:56 PM  LOS: 2 days

## 2017-07-01 NOTE — Progress Notes (Signed)
  Echocardiogram 2D Echocardiogram has been performed.  Tierney Behl T Sedric Guia 07/01/2017, 2:14 PM

## 2017-07-02 MED ORDER — AMLODIPINE BESYLATE 10 MG PO TABS: 10.0000 mg | ORAL_TABLET | Freq: Every day | ORAL | 2 refills | Status: DC

## 2017-07-02 MED ORDER — LEVOFLOXACIN 750 MG PO TABS: 750.0000 mg | ORAL_TABLET | Freq: Every day | ORAL | 0 refills | Status: DC

## 2017-07-02 MED ORDER — THIAMINE HCL 100 MG PO TABS: 100.0000 mg | ORAL_TABLET | Freq: Every day | ORAL | 1 refills | Status: DC

## 2017-07-02 MED ORDER — LEVOFLOXACIN 750 MG PO TABS
750.0000 mg | ORAL_TABLET | Freq: Every day | ORAL | Status: DC
  Administered 2017-07-02: 750 mg via ORAL
  Filled 2017-07-02: qty 1

## 2017-07-02 MED ORDER — FOLIC ACID 1 MG PO TABS: 1.0000 mg | ORAL_TABLET | Freq: Every day | ORAL | 1 refills | Status: DC

## 2017-07-02 MED ORDER — CLONIDINE HCL 0.1 MG PO TABS: 0.1000 mg | ORAL_TABLET | Freq: Two times a day (BID) | ORAL | 2 refills | Status: DC

## 2017-07-02 MED ORDER — PANTOPRAZOLE SODIUM 40 MG PO TBEC: 40.0000 mg | DELAYED_RELEASE_TABLET | Freq: Every day | ORAL | Status: DC

## 2017-07-02 NOTE — Discharge Summary (Signed)
Physician Discharge Summary  Jordan Hudson:[id number] DOB: 1986-01-12 DOA: 06/29/2017  PCP: Patient, No Pcp Per  Admit date: 06/29/2017 Discharge date: 07/02/2017  Time spent: 35 minutes  Recommendations for Outpatient Follow-up:  1. Follow-up PCP in 2 weeks,  2. Follow-up repeat blood culture results done on 07/01/2017 as outpatient. 3. Patient will be discharged on Levaquin 750 mg p.o. daily for 10 more days.   Discharge Diagnoses:  Active Problems:   Acute hyperactive alcohol withdrawal delirium (HCC) Left lower lobe pneumonia  Discharge Condition: *stable  Diet recommendation: Low-salt diet  Filed Weights   06/30/17 0500 07/01/17 0439 07/02/17 0510  Weight: 62.8 kg (138 lb 7.2 oz) 62 kg (136 lb 11 oz) 64.9 kg (143 lb)    History of present illness:  31 year old male with a history of alcohol abuse, hypertension, admitted with left lower lobe community-acquired pneumonia.  Patient also has history of alcohol abuse and hospital course was complicated by alcohol withdrawal delirium.  Delirium significantly improved with Precedex.  He is currently off Precedex.   Hospital Course:  1. Left lower lobe pneumonia-patient has left lower lobe pneumonia.  Blood cultures growing strep pneumonia.  Which is sensitive to ceftriaxone and Levaquin.    Patient was started on ceftriaxone.  He has completed 4 days of antibiotics in the hospital.  We will discharge on Levaquin 750 mg p.o. daily for 10 more days. 2. Strep pneumonia bacteremia-likely from pneumonia, as above.  Urinary strep pneumo antigen was positive.  2D echo done in the hospital did not show any valve vegetation.  Repeat blood cultures done on 07/01/2017 are negative to date.  Will need to follow-up blood cultures as outpatient.  I have conveyed this to patient. 3. Hypertension-blood pressure is elevated, patient's home medication listed Catapres 0.1 mg p.o. twice daily, will discontinued Catapres patch and restarted  Catapres 0.1 p.o. twice daily.  Also added amlodipine 10 mg p.o. Daily.    Will discharge patient home on amlodipine 10 mg p.o. daily, Catapres 0.1 mg p.o. twice daily. 4. Alcohol withdrawal delirium-resolved-continue  Folate, thiamine  Procedures:  2D echocardiogram  Consultations:  P CCM  Discharge Exam: Vitals:   07/01/17 2032 07/02/17 0510  BP: (!) 149/100 (!) 158/96  Pulse: (!) 104 60  Resp: 16 16  Temp: 98.5 F (36.9 C) 98.4 F (36.9 C)  SpO2: 99% 100%    General: Appears in no acute distress Cardiovascular: S1-S2, regular Respiratory: Clear to auscultation bilaterally  Discharge Instructions   Discharge Instructions    Diet - low sodium heart healthy    Complete by:  As directed    Increase activity slowly    Complete by:  As directed      Current Discharge Medication List    START taking these medications   Details  amLODipine (NORVASC) 10 MG tablet Take 1 tablet (10 mg total) by mouth daily. Qty: 30 tablet, Refills: 2    folic acid (FOLVITE) 1 MG tablet Take 1 tablet (1 mg total) by mouth daily. Qty: 30 tablet, Refills: 1    levofloxacin (LEVAQUIN) 750 MG tablet Take 1 tablet (750 mg total) by mouth daily. Qty: 10 tablet, Refills: 0    thiamine 100 MG tablet Take 1 tablet (100 mg total) by mouth daily. Qty: 30 tablet, Refills: 1      CONTINUE these medications which have CHANGED   Details  cloNIDine (CATAPRES) 0.1 MG tablet Take 1 tablet (0.1 mg total) by mouth 2 (two) times daily. Qty: 60  tablet, Refills: 2      STOP taking these medications     ibuprofen (ADVIL,MOTRIN) 200 MG tablet        No Known Allergies Follow-up Information    [facility] [employer]. Call on 07/01/2017.   Specialty:  Professional Counselor Why:  You may walk in any day between 8am and 3pm for appointment and assessment. You may also call and schedule an appointment to be seen by intake regarding therapy/medications for mental health. Contact  information: [employer] [facility] [place] [address] [place] [address] [phone]        [facility]. Go on 07/01/2017.   Specialty:  Behavioral Health Why:  This is another option for mental health services. You may walk into [facility] monday-friday 8am-330 for assessment/appointment. Please bring ID and hospital paperwork. Contact information: [address] [place] [address] [phone]            The results of significant diagnostics from this hospitalization (including imaging, microbiology, ancillary and laboratory) are listed below for reference.    Significant Diagnostic Studies: Dg Chest 2 View  Result Date: 07/01/2017 CLINICAL DATA:  Follow-up of pneumonia EXAM: CHEST  2 VIEW COMPARISON:  Chest x-ray of June 30, 2017 FINDINGS: There is persistent left lower lobe infiltrate. A small amount of pleural fluid on the left is suspected. There remainder of the left lung is clear as is the right lung. The heart and mediastinal structures are normal. The bony thorax is unremarkable. IMPRESSION: Persistent left lower lobe pneumonia. There has been some improvement since yesterday's study. Electronically Signed   By: David  [place] M.D.   On: 07/01/2017 09:39   Dg Chest 2 View  Result Date: 06/29/2017 CLINICAL DATA:  Cough, chest pain, fever, shortness of breath. Smoker. EXAM: CHEST  2 VIEW COMPARISON:  None. FINDINGS: Dense consolidation LEFT lower lobe. No pleural effusion. Cardiomediastinal silhouette is normal. No pneumothorax. Soft tissue planes and included osseous structures are normal. IMPRESSION: LEFT lower lobe pneumonia. Electronically Signed   By: Awilda Metro M.D.   On: 06/29/2017 03:54   Dg Chest Port 1 View  Result Date: 06/30/2017 CLINICAL DATA:  Pneumonia EXAM: PORTABLE CHEST 1 VIEW COMPARISON:  06/29/2017 FINDINGS: Left lower lobe pneumonia is improved. Significant consolidation at the left base persists. Right lung is clear. Left  upper lobe is clear. Normal heart size. No pneumothorax. IMPRESSION: Improving left lower lobe pneumonia. Significant consolidation persists. Continued follow-up until resolution is recommended. Electronically Signed   By: Jolaine Click M.D.   On: 06/30/2017 08:46    Microbiology: Recent Results (from the past 240 hour(s))  Blood culture (routine x 2)     Status: Abnormal   Collection Time: 06/29/17  2:37 AM  Result Value Ref Range Status   Specimen Description BLOOD RIGHT ARM  Final   Special Requests   Final    BOTTLES DRAWN AEROBIC AND ANAEROBIC Blood Culture adequate volume   Culture  Setup Time   Final    GRAM POSITIVE COCCI IN PAIRS IN BOTH AEROBIC AND ANAEROBIC BOTTLES CRITICAL RESULT CALLED TO, READ BACK BY AND VERIFIED WITH: B GREEN PHARMD 2328 06/29/17 A BROWNING    Culture (A)  Final    STREPTOCOCCUS PNEUMONIAE SUSCEPTIBILITIES PERFORMED ON PREVIOUS CULTURE WITHIN THE LAST 5 DAYS. Performed at [facility], [address], [place], [place] [address]    Report Status 07/01/2017 FINAL  Final  Blood culture (routine x 2)  Status: Abnormal   Collection Time: 06/29/17  3:00 AM  Result Value Ref Range Status   Specimen Description BLOOD LEFT ANTECUBITAL  Final   Special Requests   Final    BOTTLES DRAWN AEROBIC AND ANAEROBIC Blood Culture adequate volume   Culture  Setup Time   Final    GRAM POSITIVE COCCI IN PAIRS IN BOTH AEROBIC AND ANAEROBIC BOTTLES CRITICAL RESULT CALLED TO, READ BACK BY AND VERIFIED WITH: B GREEN PHARMD 2328 06/29/17 A BROWNING Performed at [facility], [address] [place], [place] [address]    Culture STREPTOCOCCUS PNEUMONIAE (A)  Final   Report Status 07/01/2017 FINAL  Final   Organism ID, Bacteria STREPTOCOCCUS PNEUMONIAE  Final      Susceptibility   Streptococcus pneumoniae - MIC*    ERYTHROMYCIN <=0.12 SENSITIVE Sensitive     LEVOFLOXACIN 1 SENSITIVE Sensitive     PENICILLIN (non-meningitis) <=0.06 SENSITIVE Sensitive      CEFTRIAXONE (non-meningitis) <=0.12 SENSITIVE Sensitive     * STREPTOCOCCUS PNEUMONIAE  Blood Culture ID Panel (Reflexed)     Status: Abnormal   Collection Time: 06/29/17  3:00 AM  Result Value Ref Range Status   Enterococcus species NOT DETECTED NOT DETECTED Final   Listeria monocytogenes NOT DETECTED NOT DETECTED Final   Staphylococcus species NOT DETECTED NOT DETECTED Final   Staphylococcus aureus NOT DETECTED NOT DETECTED Final   Streptococcus species DETECTED (A) NOT DETECTED Final    Comment: CRITICAL RESULT CALLED TO, READ BACK BY AND VERIFIED WITH: B GREEN PHARMD 2328 06/29/17 A BROWNING    Streptococcus agalactiae NOT DETECTED NOT DETECTED Final   Streptococcus pneumoniae DETECTED (A) NOT DETECTED Final    Comment: CRITICAL RESULT CALLED TO, READ BACK BY AND VERIFIED WITH: B GREEN PHARMD 2328 06/29/17 A BROWNING    Streptococcus pyogenes NOT DETECTED NOT DETECTED Final   Acinetobacter baumannii NOT DETECTED NOT DETECTED Final   Enterobacteriaceae species NOT DETECTED NOT DETECTED Final   Enterobacter cloacae complex NOT DETECTED NOT DETECTED Final   Escherichia coli NOT DETECTED NOT DETECTED Final   Klebsiella oxytoca NOT DETECTED NOT DETECTED Final   Klebsiella pneumoniae NOT DETECTED NOT DETECTED Final   Proteus species NOT DETECTED NOT DETECTED Final   Serratia marcescens NOT DETECTED NOT DETECTED Final   Haemophilus influenzae NOT DETECTED NOT DETECTED Final   Neisseria meningitidis NOT DETECTED NOT DETECTED Final   Pseudomonas aeruginosa NOT DETECTED NOT DETECTED Final   Candida albicans NOT DETECTED NOT DETECTED Final   Candida glabrata NOT DETECTED NOT DETECTED Final   Candida krusei NOT DETECTED NOT DETECTED Final   Candida parapsilosis NOT DETECTED NOT DETECTED Final   Candida tropicalis NOT DETECTED NOT DETECTED Final    Comment: Performed at [facility], [address], [place], [place] [address]  MRSA PCR Screening     Status: None   Collection Time:  06/29/17  2:48 PM  Result Value Ref Range Status   MRSA by PCR NEGATIVE NEGATIVE Final    Comment:        The GeneXpert MRSA Assay (FDA approved for NASAL specimens only), is one component of a comprehensive MRSA colonization surveillance program. It is not intended to diagnose MRSA infection nor to guide or monitor treatment for MRSA infections.      Labs: Basic Metabolic Panel:  Recent Labs Lab 06/29/17 0234 06/29/17 1544 06/30/17 0333 06/30/17 1455 07/01/17 0601  NA 133* 137 140 137  --   K 2.6* 3.0* 2.6* 4.5  --  CL 90* 101 100* 100*  --   CO2 26 23 25 25   --   GLUCOSE 137* 85 82 93  --   BUN <5* <5* 7 10  --   CREATININE 0.73 0.70 0.86 1.04  --   CALCIUM 9.0 8.5* 8.9 8.6*  --   MG 1.6*  --   --   --  1.9  PHOS  --   --  3.9  --   --    Liver Function Tests:  Recent Labs Lab 06/29/17 0234  AST 20  ALT 11*  ALKPHOS 78  BILITOT 0.8  PROT 8.1  ALBUMIN 3.4*   No results for input(s): LIPASE, AMYLASE in the last 168 hours. No results for input(s): AMMONIA in the last 168 hours. CBC:  Recent Labs Lab 06/29/17 0234 06/30/17 0333 07/01/17 0601  WBC 12.6* 12.4* 9.8  NEUTROABS 10.2*  --   --   HGB 12.7* 12.0* 11.6*  HCT 35.1* 33.3* 32.3*  MCV 97.5 99.1 98.2  PLT 283 334 448*   Cardiac Enzymes: No results for input(s): CKTOTAL, CKMB, CKMBINDEX, TROPONINI in the last 168 hours. BNP: BNP (last 3 results) No results for input(s): BNP in the last 8760 hours.  ProBNP (last 3 results) No results for input(s): PROBNP in the last 8760 hours.  CBG: No results for input(s): GLUCAP in the last 168 hours.     SignedMeredeth Ide:  Alcides Nutting S MD.  [facility] 07/02/2017, 11:50 AM

## 2017-07-02 NOTE — Progress Notes (Signed)
Pharmacy Antibiotic Note  Jordan Hudson is a 31 y.o. male admitted on 06/29/2017 with pneumonia.  Pharmacy has been consulted for levaquin dosing. Day#4 of abx. AF. WBC WNL. BCx = for pan sensitive strep pneumo.  Plan: levaquin 750 mg po q24  Height: 5\' 8"  (172.7 cm) Weight: 143 lb (64.9 kg) IBW/kg (Calculated) : 68.4  Temp (24hrs), Avg:98.9 F (37.2 C), Min:98.4 F (36.9 C), Max:99.6 F (37.6 C)   Recent Labs Lab 06/29/17 0234 06/29/17 0242 06/29/17 0506 06/29/17 1544 06/30/17 0333 06/30/17 1455 07/01/17 0601  WBC 12.6*  --   --   --  12.4*  --  9.8  CREATININE 0.73  --   --  0.70 0.86 1.04  --   LATICACIDVEN  --  1.96* 1.59  --   --   --   --     Estimated Creatinine Clearance: 94.5 mL/min (by C-G formula based on SCr of 1.04 mg/dL).    No Known Allergies Thank you for allowing pharmacy to be a part of this patient's care.  Jordan Hudson, Pharm.D. [phone] 07/02/2017 10:30 AM

## 2017-07-02 NOTE — Progress Notes (Signed)
The patient is receiving Protonix by the intravenous route.  Based on criteria approved by the Pharmacy and Therapeutics Committee and the Medical Executive Committee, the medication is being converted to the equivalent oral dose form.  These criteria include: -No active GI bleeding -Able to tolerate diet of full liquids (or better) or tube feeding -Able to tolerate other medications by the oral or enteral route  If you have any questions about this conversion, please contact the Pharmacy Department (phone [phone]).  Thank you. Herby AbrahamMichelle T. Clista Rainford, Pharm.D. [phone] 07/02/2017 8:52 AM

## 2017-07-06 LAB — CULTURE, BLOOD (ROUTINE X 2)
CULTURE: NO GROWTH
CULTURE: NO GROWTH
SPECIAL REQUESTS: ADEQUATE
SPECIAL REQUESTS: ADEQUATE

## 2017-07-15 ENCOUNTER — Ambulatory Visit: Payer: Self-pay | Attending: Critical Care Medicine | Admitting: Critical Care Medicine

## 2017-07-15 ENCOUNTER — Encounter: Payer: Self-pay | Admitting: Critical Care Medicine

## 2017-07-15 VITALS — BP 129/75 | HR 75 | Temp 98.5°F | Resp 18 | Ht 68.0 in | Wt 147.0 lb

## 2017-07-15 DIAGNOSIS — Z8249 Family history of ischemic heart disease and other diseases of the circulatory system: Secondary | ICD-10-CM | POA: Insufficient documentation

## 2017-07-15 DIAGNOSIS — Z79899 Other long term (current) drug therapy: Secondary | ICD-10-CM | POA: Insufficient documentation

## 2017-07-15 DIAGNOSIS — R7881 Bacteremia: Secondary | ICD-10-CM

## 2017-07-15 DIAGNOSIS — F10231 Alcohol dependence with withdrawal delirium: Secondary | ICD-10-CM

## 2017-07-15 DIAGNOSIS — R6520 Severe sepsis without septic shock: Secondary | ICD-10-CM

## 2017-07-15 DIAGNOSIS — Z833 Family history of diabetes mellitus: Secondary | ICD-10-CM | POA: Insufficient documentation

## 2017-07-15 DIAGNOSIS — J13 Pneumonia due to Streptococcus pneumoniae: Secondary | ICD-10-CM

## 2017-07-15 DIAGNOSIS — Z8701 Personal history of pneumonia (recurrent): Secondary | ICD-10-CM | POA: Insufficient documentation

## 2017-07-15 DIAGNOSIS — F17210 Nicotine dependence, cigarettes, uncomplicated: Secondary | ICD-10-CM | POA: Insufficient documentation

## 2017-07-15 DIAGNOSIS — Z23 Encounter for immunization: Secondary | ICD-10-CM

## 2017-07-15 DIAGNOSIS — I10 Essential (primary) hypertension: Secondary | ICD-10-CM | POA: Insufficient documentation

## 2017-07-15 DIAGNOSIS — A403 Sepsis due to Streptococcus pneumoniae: Secondary | ICD-10-CM

## 2017-07-15 DIAGNOSIS — E876 Hypokalemia: Secondary | ICD-10-CM

## 2017-07-15 DIAGNOSIS — F1020 Alcohol dependence, uncomplicated: Secondary | ICD-10-CM | POA: Insufficient documentation

## 2017-07-15 DIAGNOSIS — B953 Streptococcus pneumoniae as the cause of diseases classified elsewhere: Secondary | ICD-10-CM | POA: Insufficient documentation

## 2017-07-15 DIAGNOSIS — R030 Elevated blood-pressure reading, without diagnosis of hypertension: Secondary | ICD-10-CM

## 2017-07-15 DIAGNOSIS — F10931 Alcohol use, unspecified with withdrawal delirium: Secondary | ICD-10-CM

## 2017-07-15 DIAGNOSIS — F10230 Alcohol dependence with withdrawal, uncomplicated: Secondary | ICD-10-CM

## 2017-07-15 DIAGNOSIS — F1021 Alcohol dependence, in remission: Secondary | ICD-10-CM

## 2017-07-15 DIAGNOSIS — Z09 Encounter for follow-up examination after completed treatment for conditions other than malignant neoplasm: Secondary | ICD-10-CM | POA: Insufficient documentation

## 2017-07-15 NOTE — Assessment & Plan Note (Signed)
Bacteremia resolved

## 2017-07-15 NOTE — Assessment & Plan Note (Signed)
Hypokalemia resolved 

## 2017-07-15 NOTE — Assessment & Plan Note (Signed)
LLL pneumococcal pneumonia, resolved Rec: No further abx Smoking cessation

## 2017-07-15 NOTE — Patient Instructions (Signed)
No more antibiotic needed Focus on cutting back tobacco and alcohol use You may stop thiamine and folic acid in one month Stay on blood pressure medications for now Return visit to establish with primary care MD here will be made Flu vaccine and pneumonia vaccine were given

## 2017-07-15 NOTE — Assessment & Plan Note (Signed)
Severe sepsis resolved

## 2017-07-15 NOTE — Progress Notes (Signed)
Patient is here for hospital f/up  

## 2017-07-15 NOTE — Assessment & Plan Note (Signed)
HTN now on amlodipine and clonidene and BP well controled .  No end organ damage,  Cr ok Plan  Cont current BP meds F/u with PCP to establish

## 2017-07-15 NOTE — Assessment & Plan Note (Signed)
ETOH withdrawal Resolved Cont thiamine folic acid x one month

## 2017-07-15 NOTE — Progress Notes (Signed)
Subjective:    Patient ID: Jordan Hudson, male    DOB: 06/04/1986, 31 y.o.   MRN: [id number]  31 year old male with a history of alcohol abuse, hypertension, admitted 10/29 and d/c 11/1  with left lower lobe community-acquired pneumonia.  Patient also has history of alcohol abuse and hospital course was complicated by alcohol withdrawal delirium.  Pt here for post hosp f/u  Since d/c is better    Shortness of Breath  This is a new problem. The current episode started 1 to 4 weeks ago. The problem occurs daily (exertion only). The problem has been rapidly improving. Associated symptoms include rhinorrhea and sputum production. Pertinent negatives include no abdominal pain, chest pain, ear pain, fever, hemoptysis, leg pain, leg swelling, orthopnea, PND, sore throat, vomiting or wheezing. 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 Associated symptoms comments: Cough still productive yellow to white. Risk factors include smoking. There is no history of asthma, CAD, COPD, a heart failure or pneumonia.     Past Medical History:  Diagnosis Date  . Anxiety   . Depression    8 months  . Hypertension      Family History  Problem Relation Age of Onset  . Hypertension Mother   . Heart failure Father   . Diabetes Father   . Hypertension Brother      Social History   Socioeconomic History  . Marital status: Single    Spouse name: Not on file  . Number of children: Not on file  . Years of education: Not on file  . Highest education level: Not on file  Social Needs  . Financial resource strain: Not on file  . Food insecurity - worry: Not on file  . Food insecurity - inability: Not on file  . Transportation needs - medical: Not on file  . Transportation needs - non-medical: Not on file  Occupational History  . Not on file  Tobacco Use  . Smoking status: Current Every Day Smoker    Packs/day: 0.50    Years: 10.00    Pack years: 5.00    Types:  Cigarettes  . Smokeless tobacco: Never Used  Substance and Sexual Activity  . Alcohol use: Yes    Comment: 2-3 beers on the weekend  . Drug use: Yes    Types: Marijuana    Comment: weekend only  . Sexual activity: Yes    Birth control/protection: Condom    Comment: Not since Feb  Other Topics Concern  . Not on file  Social History Narrative  . Not on file     No Known Allergies   Outpatient Medications Prior to Visit  Medication Sig Dispense Refill  . amLODipine (NORVASC) 10 MG tablet Take 1 tablet (10 mg total) by mouth daily. 30 tablet 2  . cloNIDine (CATAPRES) 0.1 MG tablet Take 1 tablet (0.1 mg total) by mouth 2 (two) times daily. 60 tablet 2  . folic acid (FOLVITE) 1 MG tablet Take 1 tablet (1 mg total) by mouth daily. 30 tablet 1  . thiamine 100 MG tablet Take 1 tablet (100 mg total) by mouth daily. 30 tablet 1  . levofloxacin (LEVAQUIN) 750 MG tablet Take 1 tablet (750 mg total) by mouth daily. (Patient not taking: Reported on 07/15/2017) 10 tablet 0   No facility-administered medications prior to visit.     Review of Systems  Constitutional: Negative for fever.  HENT: Positive for rhinorrhea. Negative for ear pain and sore throat.  Respiratory: Positive for sputum production and shortness of breath. Negative for hemoptysis and wheezing.   Cardiovascular: Negative for chest pain, orthopnea, leg swelling and PND.  Gastrointestinal: Negative for abdominal pain and vomiting.       Objective:   Physical Exam  Vitals:   07/15/17 0859  BP: 129/75  Pulse: 75  Resp: 18  Temp: 98.5 F (36.9 C)  TempSrc: Oral  SpO2: 100%  Weight: 147 lb (66.7 kg)  Height: 5\' 8"  (1.727 m)    Gen: Pleasant, well-nourished, in no distress,  normal affect  ENT: No lesions,  mouth clear,  oropharynx clear, no postnasal drip  Neck: No JVD, no TMG, no carotid bruits  Lungs: No use of accessory muscles, no dullness to percussion, clear without rales or rhonchi  Cardiovascular:  RRR, heart sounds normal, no murmur or gallops, no peripheral edema  Abdomen: soft and NT, no HSM,  BS normal  Musculoskeletal: No deformities, no cyanosis or clubbing  Neuro: alert, non focal  Skin: Warm, no lesions or rashes  No results found.  All labs and xrays from hospital reviewed     Assessment & Plan:  I personally reviewed all images and lab data in the [facility] system as well as any outside material available during this office visit and agree with the  radiology impressions.   Pneumococcal pneumonia (HCC) LLL pneumococcal pneumonia, resolved Rec: No further abx Smoking cessation  Acute hyperactive alcohol withdrawal delirium (HCC) ETOH withdrawal Resolved Cont thiamine folic acid x one month  ELEVATED BLOOD PRESSURE HTN now on amlodipine and clonidene and BP well controled .  No end organ damage,  Cr ok Plan  Cont current BP meds F/u with PCP to establish  Severe sepsis due to pneumococcus with acute organ dysfunction (HCC) Severe sepsis resolved  Pneumococcal bacteremia Bacteremia resolved  Hypokalemia Hypokalemia  resolved   Reuel BoomDaniel was seen today for follow-up.  Diagnoses and all orders for this visit:  Severe sepsis due to pneumococcus with acute organ dysfunction (HCC)  Pneumococcal bacteremia  Hypokalemia  ALCOHOL ABUSE, HX OF  Chronic alcohol dependence, continuous (HCC)  Acute hyperactive alcohol withdrawal delirium (HCC)  Alcohol dependence with withdrawal, uncomplicated (HCC)  Pneumonia of left lower lobe due to Streptococcus pneumoniae (HCC)  ELEVATED BLOOD PRESSURE    I had an extended discussion with the patient and or family lasting 10 minutes of a 25 minute visit including:  Smoking cessation, alcohol consumption reduction, need to stay on BP meds and establish with PCP  Needs Pneumovax and flu vaccine

## 2017-08-03 ENCOUNTER — Ambulatory Visit: Payer: Self-pay | Attending: Nurse Practitioner | Admitting: Nurse Practitioner

## 2017-08-03 ENCOUNTER — Encounter: Payer: Self-pay | Admitting: Nurse Practitioner

## 2017-08-03 VITALS — BP 137/80 | HR 105 | Temp 98.7°F | Resp 12 | Ht 68.0 in | Wt 150.4 lb

## 2017-08-03 DIAGNOSIS — F329 Major depressive disorder, single episode, unspecified: Secondary | ICD-10-CM | POA: Insufficient documentation

## 2017-08-03 DIAGNOSIS — F419 Anxiety disorder, unspecified: Secondary | ICD-10-CM | POA: Insufficient documentation

## 2017-08-03 DIAGNOSIS — Z79899 Other long term (current) drug therapy: Secondary | ICD-10-CM | POA: Insufficient documentation

## 2017-08-03 DIAGNOSIS — I10 Essential (primary) hypertension: Secondary | ICD-10-CM | POA: Insufficient documentation

## 2017-08-03 DIAGNOSIS — Z8249 Family history of ischemic heart disease and other diseases of the circulatory system: Secondary | ICD-10-CM | POA: Insufficient documentation

## 2017-08-03 DIAGNOSIS — F17200 Nicotine dependence, unspecified, uncomplicated: Secondary | ICD-10-CM | POA: Insufficient documentation

## 2017-08-03 DIAGNOSIS — Z716 Tobacco abuse counseling: Secondary | ICD-10-CM

## 2017-08-03 DIAGNOSIS — F17210 Nicotine dependence, cigarettes, uncomplicated: Secondary | ICD-10-CM | POA: Insufficient documentation

## 2017-08-03 DIAGNOSIS — Z833 Family history of diabetes mellitus: Secondary | ICD-10-CM | POA: Insufficient documentation

## 2017-08-03 MED ORDER — CLONIDINE HCL 0.1 MG PO TABS: 0.1000 mg | ORAL_TABLET | Freq: Two times a day (BID) | ORAL | 3 refills | Status: DC

## 2017-08-03 MED ORDER — AMLODIPINE BESYLATE 10 MG PO TABS: 10.0000 mg | ORAL_TABLET | Freq: Every day | ORAL | 2 refills | Status: DC

## 2017-08-03 NOTE — Progress Notes (Signed)
Assessment & Plan:  Jordan BoomDaniel was seen today for establish care.  Diagnoses and all orders for this visit:  Essential hypertension -     amLODipine (NORVASC) 10 MG tablet; Take 1 tablet (10 mg total) by mouth daily. -     cloNIDine (CATAPRES) 0.1 MG tablet; Take 1 tablet (0.1 mg total) by mouth 2 (two) times daily. Continue all antihypertensives as prescribed.  Remember to bring in your blood pressure log with you for your follow up appointment.  DASH/Mediterranean Diets are healthier choices for HTN.    Encounter for smoking cessation counseling Tobacco dependence Jordan BoomDaniel was counseled on the dangers of tobacco use, and was advised to quit. Reviewed strategies to maximize success, including removing cigarettes and smoking materials from environment, stress management and support of family/friends as well as pharmacological alternatives including: Wellbutrin, Chantix, Nicotine patch, Nicotine gum or lozenges. Smoking cessation support: smoking cessation hotline: 1-800-QUIT-NOW.  Smoking cessation classes are also available through [facility]. Call [phone] or visit our website at [email or web address].   Spent 5 minutes counseling on smoking cessation and patient is not ready to quit.     Patient has been counseled on age-appropriate routine health concerns for screening and prevention. These are reviewed and up-to-date. Referrals have been placed accordingly. Immunizations are up-to-date or declined.    Subjective:   Chief Complaint  Patient presents with  . Establish Care    Patient is here to establish care for hypertension. Patient need medication refills.    HPI Jordan Hudson 31 y.o. male presents to office today to establish care.   Hypertension He has a history of essential hypertension. Was recently was started on amlodipine and clonidine during a hospital admission on 06-2017 for sepsis in which he was initially diagnosed with hypertension.  He endorses a strong family history of malignant hypertension.  He had a follow up appointment for that hospital admission on 07-15-2017. Today he is normotensive. Endorses medication compliance. Denies chest pain, shortness of breath, palpitations, lightheadedness, dizziness, headaches or BLE edema.  BP Readings from Last 3 Encounters:  08/03/17 137/80  07/15/17 129/75  07/02/17 (!) 158/96      Tobacco Dependence He has been a smoker since the age of [age]. He smokes around 5-6 cigarettes per day. He has quit in the past which lasted for 3 months. He is not ready to quit. He has a history of alcohol abuse as well with alcohol withdrawal delirium. Currently still taking thiamine and folic acid daily. Denies any adverse symptoms today.   Review of Systems  Constitutional: Negative for fever, malaise/fatigue and weight loss.  HENT: Negative.  Negative for nosebleeds.   Eyes: Negative.  Negative for blurred vision, double vision and photophobia.  Respiratory: Negative.  Negative for cough and shortness of breath.   Cardiovascular: Negative.  Negative for chest pain, palpitations and leg swelling.  Gastrointestinal: Negative.  Negative for abdominal pain, constipation, diarrhea, heartburn, nausea and vomiting.  Musculoskeletal: Negative.  Negative for myalgias.  Neurological: Negative.  Negative for dizziness, focal weakness, seizures and headaches.  Endo/Heme/Allergies: Negative for environmental allergies.  Psychiatric/Behavioral: Negative.  Negative for suicidal ideas.    Past Medical History:  Diagnosis Date  . Anxiety   . Depression    8 months  . Hypertension     History reviewed. No pertinent surgical history.  Family History  Problem Relation Age of Onset  . Hypertension Mother   . Heart failure Father   . Diabetes Father   .  Hypertension Brother     Social History Reviewed with no changes to be made today.   Outpatient Medications Prior to Visit  Medication Sig Dispense  Refill  . folic acid (FOLVITE) 1 MG tablet Take 1 tablet (1 mg total) by mouth daily. 30 tablet 1  . thiamine 100 MG tablet Take 1 tablet (100 mg total) by mouth daily. 30 tablet 1  . amLODipine (NORVASC) 10 MG tablet Take 1 tablet (10 mg total) by mouth daily. 30 tablet 2  . cloNIDine (CATAPRES) 0.1 MG tablet Take 1 tablet (0.1 mg total) by mouth 2 (two) times daily. 60 tablet 2   No facility-administered medications prior to visit.     No Known Allergies     Objective:    BP 137/80 (BP Location: Left Arm, Patient Position: Sitting, Cuff Size: Normal)   Pulse (!) 105   Temp 98.7 F (37.1 C) (Oral)   Resp 12   Ht 5\' 8"  (1.727 m)   Wt 150 lb 6.4 oz (68.2 kg)   SpO2 96%   BMI 22.87 kg/m  Wt Readings from Last 3 Encounters:  08/03/17 150 lb 6.4 oz (68.2 kg)  07/15/17 147 lb (66.7 kg)  07/02/17 143 lb (64.9 kg)    Physical Exam  Constitutional: He is oriented to person, place, and time. He appears well-developed and well-nourished. He is cooperative.  HENT:  Head: Normocephalic and atraumatic.  Eyes: EOM are normal.  Neck: Normal range of motion.  Cardiovascular: Normal rate, regular rhythm, normal heart sounds and intact distal pulses. Exam reveals no gallop and no friction rub.  No murmur heard. Pulmonary/Chest: Effort normal and breath sounds normal. No tachypnea. No respiratory distress. He has no decreased breath sounds. He has no wheezes. He has no rhonchi. He has no rales. He exhibits no tenderness.  Abdominal: Soft. Bowel sounds are normal.  Musculoskeletal: Normal range of motion. He exhibits no edema.  Neurological: He is alert and oriented to person, place, and time. Coordination normal.  Skin: Skin is warm and dry.  Psychiatric: He has a normal mood and affect. His behavior is normal. Judgment and thought content normal.  Nursing note and vitals reviewed.        Patient has been counseled extensively about nutrition and exercise as well as the importance of  adherence with medications and regular follow-up. The patient was given clear instructions to go to ER or return to medical center if symptoms don't improve, worsen or new problems develop. The patient verbalized understanding.   Follow-up: Return in about 3 months (around 11/01/2017) for BP recheck.   Claiborne RiggZelda W Fleming, FNP-BC [facility] [place], [place] [phone]   08/03/2017, 10:13 AM

## 2017-08-31 ENCOUNTER — Emergency Department (HOSPITAL_COMMUNITY)
Admission: EM | Admit: 2017-08-31 | Discharge: 2017-09-01 | Disposition: A | Discharge status: Home / Self Care | Payer: Self-pay | Attending: Emergency Medicine | Admitting: Emergency Medicine

## 2017-08-31 ENCOUNTER — Encounter (HOSPITAL_COMMUNITY): Payer: Self-pay | Admitting: *Deleted

## 2017-08-31 ENCOUNTER — Other Ambulatory Visit: Payer: Self-pay

## 2017-08-31 DIAGNOSIS — Z5321 Procedure and treatment not carried out due to patient leaving prior to being seen by health care provider: Secondary | ICD-10-CM | POA: Insufficient documentation

## 2017-08-31 DIAGNOSIS — R109 Unspecified abdominal pain: Secondary | ICD-10-CM | POA: Insufficient documentation

## 2017-08-31 LAB — COMPREHENSIVE METABOLIC PANEL
ALBUMIN: 4.4 g/dL (ref 3.5–5.0)
ALT: 19 U/L (ref 17–63)
ANION GAP: 9 (ref 5–15)
AST: 49 U/L — ABNORMAL HIGH (ref 15–41)
Alkaline Phosphatase: 62 U/L (ref 38–126)
BILIRUBIN TOTAL: 0.6 mg/dL (ref 0.3–1.2)
BUN: 7 mg/dL (ref 6–20)
CHLORIDE: 101 mmol/L (ref 101–111)
CO2: 30 mmol/L (ref 22–32)
Calcium: 9.4 mg/dL (ref 8.9–10.3)
Creatinine, Ser: 0.84 mg/dL (ref 0.61–1.24)
GFR calc Af Amer: >60 mL/min (ref >60)
GFR calc non Af Amer: >60 mL/min (ref >60)
GLUCOSE: 91 mg/dL (ref 65–99)
POTASSIUM: 4.2 mmol/L (ref 3.5–5.1)
SODIUM: 140 mmol/L (ref 135–145)
TOTAL PROTEIN: 8 g/dL (ref 6.5–8.1)

## 2017-08-31 LAB — URINALYSIS, ROUTINE W REFLEX MICROSCOPIC
BACTERIA UA: NONE SEEN
BILIRUBIN URINE: NEGATIVE
Glucose, UA: NEGATIVE mg/dL
Hgb urine dipstick: NEGATIVE
KETONES UR: NEGATIVE mg/dL
LEUKOCYTES UA: NEGATIVE
NITRITE: NEGATIVE
Protein, ur: 30 mg/dL — AB
SPECIFIC GRAVITY, URINE: 1.018 (ref 1.005–1.030)
WBC UA: NONE SEEN WBC/hpf (ref 0–5)
pH: 9 — ABNORMAL HIGH (ref 5.0–8.0)

## 2017-08-31 LAB — CBC
HEMATOCRIT: 39.1 % (ref 39.0–52.0)
HEMOGLOBIN: 13.7 g/dL (ref 13.0–17.0)
MCH: 33.4 pg (ref 26.0–34.0)
MCHC: 35 g/dL (ref 30.0–36.0)
MCV: 95.4 fL (ref 78.0–100.0)
Platelets: 196 10*3/uL (ref 150–400)
RBC: 4.1 MIL/uL — ABNORMAL LOW (ref 4.22–5.81)
RDW: 13.3 % (ref 11.5–15.5)
WBC: 4.6 10*3/uL (ref 4.0–10.5)

## 2017-08-31 LAB — LIPASE, BLOOD: LIPASE: 32 U/L (ref 11–51)

## 2017-08-31 NOTE — ED Notes (Signed)
Called pt for room. No response 

## 2017-08-31 NOTE — ED Triage Notes (Addendum)
Pt reports ongoing mid abd pain that has become more severe over past few days. Having n/v/d. Pain has moved more to RUQ. No acute distress is noted at triage.

## 2017-09-01 ENCOUNTER — Encounter (HOSPITAL_COMMUNITY): Payer: Self-pay | Admitting: Emergency Medicine

## 2017-09-01 ENCOUNTER — Other Ambulatory Visit: Payer: Self-pay

## 2017-09-01 ENCOUNTER — Emergency Department (HOSPITAL_COMMUNITY): Payer: Self-pay

## 2017-09-01 ENCOUNTER — Emergency Department (HOSPITAL_COMMUNITY)
Admission: EM | Admit: 2017-09-01 | Discharge: 2017-09-01 | Disposition: A | Discharge status: Home / Self Care | Payer: Self-pay | Attending: Physician Assistant | Admitting: Physician Assistant

## 2017-09-01 DIAGNOSIS — Z79899 Other long term (current) drug therapy: Secondary | ICD-10-CM | POA: Insufficient documentation

## 2017-09-01 DIAGNOSIS — I10 Essential (primary) hypertension: Secondary | ICD-10-CM | POA: Insufficient documentation

## 2017-09-01 DIAGNOSIS — R1011 Right upper quadrant pain: Secondary | ICD-10-CM

## 2017-09-01 DIAGNOSIS — K852 Alcohol induced acute pancreatitis without necrosis or infection: Secondary | ICD-10-CM | POA: Insufficient documentation

## 2017-09-01 DIAGNOSIS — F17210 Nicotine dependence, cigarettes, uncomplicated: Secondary | ICD-10-CM | POA: Insufficient documentation

## 2017-09-01 HISTORY — DX: Alcohol abuse, uncomplicated: F10.10

## 2017-09-01 LAB — URINALYSIS, ROUTINE W REFLEX MICROSCOPIC
BILIRUBIN URINE: NEGATIVE
Glucose, UA: NEGATIVE mg/dL
Hgb urine dipstick: NEGATIVE
KETONES UR: 20 mg/dL — AB
LEUKOCYTES UA: NEGATIVE
Nitrite: NEGATIVE
PROTEIN: 30 mg/dL — AB
SPECIFIC GRAVITY, URINE: 1.021 (ref 1.005–1.030)
pH: 7 (ref 5.0–8.0)

## 2017-09-01 LAB — COMPREHENSIVE METABOLIC PANEL
ALT: 22 U/L (ref 17–63)
AST: 69 U/L — AB (ref 15–41)
Albumin: 4.6 g/dL (ref 3.5–5.0)
Alkaline Phosphatase: 63 U/L (ref 38–126)
Anion gap: 11 (ref 5–15)
BUN: 8 mg/dL (ref 6–20)
CHLORIDE: 98 mmol/L — AB (ref 101–111)
CO2: 25 mmol/L (ref 22–32)
Calcium: 9.6 mg/dL (ref 8.9–10.3)
Creatinine, Ser: 0.83 mg/dL (ref 0.61–1.24)
GFR calc Af Amer: >60 mL/min (ref >60)
Glucose, Bld: 94 mg/dL (ref 65–99)
POTASSIUM: 3.6 mmol/L (ref 3.5–5.1)
Sodium: 134 mmol/L — ABNORMAL LOW (ref 135–145)
Total Bilirubin: 1.3 mg/dL — ABNORMAL HIGH (ref 0.3–1.2)
Total Protein: 8.3 g/dL — ABNORMAL HIGH (ref 6.5–8.1)

## 2017-09-01 LAB — CBC
HEMATOCRIT: 39.1 % (ref 39.0–52.0)
HEMOGLOBIN: 13.8 g/dL (ref 13.0–17.0)
MCH: 33.9 pg (ref 26.0–34.0)
MCHC: 35.3 g/dL (ref 30.0–36.0)
MCV: 96.1 fL (ref 78.0–100.0)
Platelets: 196 10*3/uL (ref 150–400)
RBC: 4.07 MIL/uL — ABNORMAL LOW (ref 4.22–5.81)
RDW: 13 % (ref 11.5–15.5)
WBC: 6 10*3/uL (ref 4.0–10.5)

## 2017-09-01 LAB — LIPASE, BLOOD: LIPASE: 70 U/L — AB (ref 11–51)

## 2017-09-01 MED ORDER — MORPHINE SULFATE (PF) 4 MG/ML IV SOLN
4.0000 mg | Freq: Once | INTRAVENOUS | Status: AC
  Administered 2017-09-01: 4 mg via INTRAVENOUS
  Filled 2017-09-01: qty 1

## 2017-09-01 MED ORDER — ONDANSETRON HCL 4 MG/2ML IJ SOLN
4.0000 mg | Freq: Once | INTRAMUSCULAR | Status: AC
  Administered 2017-09-01: 4 mg via INTRAVENOUS
  Filled 2017-09-01: qty 2

## 2017-09-01 MED ORDER — ONDANSETRON 4 MG PO TBDP: 4.0000 mg | ORAL_TABLET | Freq: Three times a day (TID) | ORAL | 0 refills | Status: DC | PRN

## 2017-09-01 MED ORDER — OXYCODONE-ACETAMINOPHEN 5-325 MG PO TABS
1.0000 | ORAL_TABLET | Freq: Once | ORAL | Status: AC
  Administered 2017-09-01: 1 via ORAL
  Filled 2017-09-01: qty 1

## 2017-09-01 MED ORDER — OXYCODONE-ACETAMINOPHEN 5-325 MG PO TABS: 1.0000 | ORAL_TABLET | Freq: Four times a day (QID) | ORAL | 0 refills | Status: DC | PRN

## 2017-09-01 MED ORDER — SODIUM CHLORIDE 0.9 % IV BOLUS (SEPSIS)
1000.0000 mL | Freq: Once | INTRAVENOUS | Status: AC
  Administered 2017-09-01: 1000 mL via INTRAVENOUS

## 2017-09-01 NOTE — ED Notes (Signed)
Patient tolerated ham sandwich.

## 2017-09-01 NOTE — Discharge Instructions (Signed)
We think that you have alcoholic pancreatitis.  We recommend that you decrease your alcohol slowly over the next couple weeks.  In addition please use this pain medication nausea medication to make sure that you are able to stay hydrated and your pain under control.  Please return if these medications are not working.

## 2017-09-01 NOTE — ED Triage Notes (Signed)
Pt reports stabbing pain in RUQ over last few days. Has not attempted to medicate. Pt c/o recurrent nausea, vomiting and frequent liquid stools. Has not eaten in a few days

## 2017-09-01 NOTE — ED Notes (Signed)
Pt called multiple times for treatment room and vitals recheck by this RN. Unable to locate in lobby, presumed to have left without being seen. 

## 2017-09-01 NOTE — ED Notes (Signed)
Patient transported to Ultrasound 

## 2017-09-01 NOTE — ED Provider Notes (Signed)
[facility]-EMERGENCY DEPT Provider Note   CSN: [id number] Arrival date & time: 09/01/17  1222     History   Chief Complaint Chief Complaint  Patient presents with  . Abdominal Pain    HPI Jordan Hudson is a 32 y.o. male.  HPI   Patient is a 32 year old male presenting with right upper quadrant pain.  Patient reports stabbing pain.  Patient reports drinking 3, 24 ounce beers a day.  Patient reports he does get shaky when he does not drink.  He reports that eating makes the pain worse.  Past Medical History:  Diagnosis Date  . Anxiety   . Depression    8 months  . Hypertension     Patient Active Problem List   Diagnosis Date Noted  . Tobacco dependence 08/03/2017  . Pneumococcal pneumonia (HCC)   . Substance induced mood disorder (HCC) 06/02/2014  . Chronic alcohol dependence, continuous (HCC) 12/03/2011    Class: Chronic  . PREMATURE EJACULATION 09/09/2010  . ELEVATED BLOOD PRESSURE 09/09/2010  . ALCOHOL ABUSE, HX OF 09/09/2010    Past Surgical History:  Procedure Laterality Date  . WISDOM TOOTH EXTRACTION         Home Medications    Prior to Admission medications   Medication Sig Start Date End Date Taking? Authorizing Provider  amLODipine (NORVASC) 10 MG tablet Take 1 tablet (10 mg total) by mouth daily. 08/03/17  Yes Claiborne Rigg, NP  cloNIDine (CATAPRES) 0.1 MG tablet Take 1 tablet (0.1 mg total) by mouth 2 (two) times daily. 08/03/17  Yes Claiborne Rigg, NP  folic acid (FOLVITE) 1 MG tablet Take 1 tablet (1 mg total) by mouth daily. 07/03/17  Yes Meredeth Ide, MD  thiamine 100 MG tablet Take 1 tablet (100 mg total) by mouth daily. 07/03/17  Yes Meredeth Ide, MD    Family History Family History  Problem Relation Age of Onset  . Hypertension Mother   . Heart failure Father   . Diabetes Father   . Hypertension Brother     Social History Social History   Tobacco Use  . Smoking status: Current Every Day Smoker   Packs/day: 0.50    Years: 10.00    Pack years: 5.00    Types: Cigarettes  . Smokeless tobacco: Never Used  Substance Use Topics  . Alcohol use: Yes    Comment: 2-3 beers on the weekend  . Drug use: Yes    Types: Marijuana    Comment: weekend only     Allergies   Patient has no known allergies.   Review of Systems Review of Systems  Constitutional: Negative for activity change, fatigue and fever.  Respiratory: Negative for shortness of breath.   Cardiovascular: Negative for chest pain.  Gastrointestinal: Positive for abdominal pain and nausea.  All other systems reviewed and are negative.    Physical Exam Updated Vital Signs BP (!) 164/107 (BP Location: Left Arm)   Pulse (!) 51   Temp 98.4 F (36.9 C) (Oral)   Resp 16   Wt 68 kg (150 lb)   SpO2 98%   BMI 22.81 kg/m   Physical Exam  Constitutional: He is oriented to person, place, and time. He appears well-nourished.  HENT:  Head: Normocephalic.  Eyes: Conjunctivae are normal.  Cardiovascular: Normal rate.  Pulmonary/Chest: Effort normal and breath sounds normal.  Abdominal: Normal appearance. There is tenderness in the right upper quadrant.  Neurological: He is oriented to person, place, and time.  Skin: Skin is warm and dry. He is not diaphoretic.  Psychiatric: He has a normal mood and affect. His behavior is normal.     ED Treatments / Results  Labs (all labs ordered are listed, but only abnormal results are displayed) Labs Reviewed  LIPASE, BLOOD - Abnormal; Notable for the following components:      Result Value   Lipase 70 (*)    All other components within normal limits  COMPREHENSIVE METABOLIC PANEL - Abnormal; Notable for the following components:   Sodium 134 (*)    Chloride 98 (*)    Total Protein 8.3 (*)    AST 69 (*)    Total Bilirubin 1.3 (*)    All other components within normal limits  CBC - Abnormal; Notable for the following components:   RBC 4.07 (*)    All other components  within normal limits  URINALYSIS, ROUTINE W REFLEX MICROSCOPIC - Abnormal; Notable for the following components:   Ketones, ur 20 (*)    Protein, ur 30 (*)    Bacteria, UA RARE (*)    Squamous Epithelial / LPF 0-5 (*)    All other components within normal limits    EKG  EKG Interpretation None       Radiology [place] Abdomen Limited Ruq  Result Date: 09/01/2017 CLINICAL DATA:  Four day history of right upper quadrant pain. EXAM: ULTRASOUND ABDOMEN LIMITED RIGHT UPPER QUADRANT COMPARISON:  None. FINDINGS: Gallbladder: No gallstones or gallbladder wall thickening. No pericholecystic fluid. The sonographer reports no sonographic Murphy's sign. Common bile duct: Diameter: 2 mm Liver: No focal lesion identified. Within normal limits in parenchymal echogenicity. Portal vein is patent on color Doppler imaging with normal direction of blood flow towards the liver. IMPRESSION: Normal right upper quadrant ultrasound. Electronically Signed   By: Kennith CenterEric  Mansell M.D.   On: 09/01/2017 16:44    Procedures Procedures (including critical care time)  Medications Ordered in ED Medications  sodium chloride 0.9 % bolus 1,000 mL (1,000 mLs Intravenous New Bag/Given 09/01/17 1616)  ondansetron (ZOFRAN) injection 4 mg (4 mg Intravenous Given 09/01/17 1618)  morphine 4 MG/ML injection 4 mg (4 mg Intravenous Given 09/01/17 1618)     Initial Impression / Assessment and Plan / ED Course  I have reviewed the triage vital signs and the nursing notes.  Pertinent labs & imaging results that were available during my care of the patient were reviewed by me and considered in my medical decision making (see chart for details).     Patient is a 32 year old male presenting with right upper quadrant pain.  Patient reports stabbing pain.  Patient reports drinking 3, 24 ounce beers a day.  Patient reports he does get shaky when he does not drink.  He reports that eating makes the pain worse.  5:10 PM Elevated lipase.  Suspect  alcoholic pancreatitis.  Will get ultrasound to rule out gallstones.  Given patient the options of inpatient versus outpatient care.  Patient has normal vital signs and has not been vomiting so is possible that patient will be able to take care of this as an outpatient.    9:16 PM Patient was able to tolerate p.o.  He has never had any vomiting.  His normal heart rate and his labs are reassuring.  Instructions given about pancreatitis.  And return precautions expressed.  Final Clinical Impressions(s) / ED Diagnoses   Final diagnoses:  RUQ abdominal pain    ED Discharge Orders    None  Abelino Derrick, MD 09/01/17 2117

## 2017-10-16 MED FILL — ?CLONIDINE HCL 0.1 MG TABL: 0.1 | 30 days supply | Qty: 60 | Fill #0

## 2017-10-16 MED FILL — AMLODIPINE BESYLATE 10 MG T: 10 | 30 days supply | Qty: 30 | Fill #0

## 2017-10-30 ENCOUNTER — Emergency Department (HOSPITAL_COMMUNITY): Payer: Self-pay

## 2017-10-30 ENCOUNTER — Encounter (HOSPITAL_COMMUNITY): Payer: Self-pay

## 2017-10-30 ENCOUNTER — Emergency Department (HOSPITAL_COMMUNITY)
Admission: EM | Admit: 2017-10-30 | Discharge: 2017-10-31 | Disposition: A | Discharge status: Home / Self Care | Payer: Self-pay | Attending: Emergency Medicine | Admitting: Emergency Medicine

## 2017-10-30 DIAGNOSIS — R059 Cough, unspecified: Secondary | ICD-10-CM

## 2017-10-30 DIAGNOSIS — Z87891 Personal history of nicotine dependence: Secondary | ICD-10-CM | POA: Insufficient documentation

## 2017-10-30 DIAGNOSIS — R0602 Shortness of breath: Secondary | ICD-10-CM | POA: Insufficient documentation

## 2017-10-30 DIAGNOSIS — R05 Cough: Secondary | ICD-10-CM | POA: Insufficient documentation

## 2017-10-30 DIAGNOSIS — Z79899 Other long term (current) drug therapy: Secondary | ICD-10-CM | POA: Insufficient documentation

## 2017-10-30 DIAGNOSIS — I10 Essential (primary) hypertension: Secondary | ICD-10-CM | POA: Insufficient documentation

## 2017-10-30 LAB — COMPREHENSIVE METABOLIC PANEL
ALBUMIN: 5.1 g/dL — AB (ref 3.5–5.0)
ALT: 35 U/L (ref 17–63)
ANION GAP: 14 (ref 5–15)
AST: 82 U/L — AB (ref 15–41)
Alkaline Phosphatase: 70 U/L (ref 38–126)
BILIRUBIN TOTAL: 0.7 mg/dL (ref 0.3–1.2)
BUN: 9 mg/dL (ref 6–20)
CHLORIDE: 98 mmol/L — AB (ref 101–111)
CO2: 26 mmol/L (ref 22–32)
Calcium: 9.8 mg/dL (ref 8.9–10.3)
Creatinine, Ser: 0.75 mg/dL (ref 0.61–1.24)
GFR calc Af Amer: >60 mL/min (ref >60)
GLUCOSE: 86 mg/dL (ref 65–99)
POTASSIUM: 3.8 mmol/L (ref 3.5–5.1)
Sodium: 138 mmol/L (ref 135–145)
TOTAL PROTEIN: 8.5 g/dL — AB (ref 6.5–8.1)

## 2017-10-30 LAB — CBC
HEMATOCRIT: 37.7 % — AB (ref 39.0–52.0)
Hemoglobin: 13.3 g/dL (ref 13.0–17.0)
MCH: 34.1 pg — ABNORMAL HIGH (ref 26.0–34.0)
MCHC: 35.3 g/dL (ref 30.0–36.0)
MCV: 96.7 fL (ref 78.0–100.0)
Platelets: 209 10*3/uL (ref 150–400)
RBC: 3.9 MIL/uL — ABNORMAL LOW (ref 4.22–5.81)
RDW: 15 % (ref 11.5–15.5)
WBC: 6.4 10*3/uL (ref 4.0–10.5)

## 2017-10-30 MED ORDER — ALBUTEROL SULFATE (2.5 MG/3ML) 0.083% IN NEBU
5.0000 mg | INHALATION_SOLUTION | Freq: Once | RESPIRATORY_TRACT | Status: DC
  Filled 2017-10-30: qty 6

## 2017-10-30 NOTE — ED Triage Notes (Signed)
Pt complains of a cough and it being hard to catch his breath for one week, he states that he had pneumonia three months ago and this feels the same

## 2017-10-31 MED ORDER — ALBUTEROL SULFATE HFA 108 (90 BASE) MCG/ACT IN AERS
2.0000 | INHALATION_SPRAY | RESPIRATORY_TRACT | Status: DC | PRN
  Administered 2017-10-31: 2 via RESPIRATORY_TRACT
  Filled 2017-10-31: qty 6.7

## 2017-10-31 MED ORDER — ALBUTEROL SULFATE (2.5 MG/3ML) 0.083% IN NEBU
INHALATION_SOLUTION | RESPIRATORY_TRACT | Status: AC
  Filled 2017-10-31: qty 3

## 2017-10-31 MED ORDER — BENZONATATE 100 MG PO CAPS: 200.0000 mg | ORAL_CAPSULE | Freq: Two times a day (BID) | ORAL | 0 refills | Status: DC | PRN

## 2017-10-31 MED ORDER — AZITHROMYCIN 250 MG PO TABS: 250.0000 mg | ORAL_TABLET | Freq: Every day | ORAL | 0 refills | Status: DC

## 2017-10-31 NOTE — ED Provider Notes (Signed)
[facility]-EMERGENCY DEPT Provider Note   CSN: [id number] Arrival date & time: 10/30/17  1908     History   Chief Complaint Chief Complaint  Patient presents with  . Shortness of Breath  . Cough    HPI Jordan Hudson is a 32 y.o. male.  Patient presents to the emergency department with chief complaint of cough and shortness of breath.  He states that he has had a cough and cold for the past week.  He states that he feels similar to when he had pneumonia in the past.  He reports that he has some associated chest pain when coughing only.  He denies any fevers or chills.  He reports associated rhinorrhea and nasal congestion, but denies sore throat.  He has not taken anything for his symptoms.  He is a daily smoker.  He denies any history of PE or DVT.  Denies any lower extremity pain or swelling.   The history is provided by the patient. No language interpreter was used.    Past Medical History:  Diagnosis Date  . Anxiety   . Depression    8 months  . ETOH abuse   . Hypertension     Patient Active Problem List   Diagnosis Date Noted  . Tobacco dependence 08/03/2017  . Pneumococcal pneumonia (HCC)   . Substance induced mood disorder (HCC) 06/02/2014  . Chronic alcohol dependence, continuous (HCC) 12/03/2011    Class: Chronic  . PREMATURE EJACULATION 09/09/2010  . ELEVATED BLOOD PRESSURE 09/09/2010  . ALCOHOL ABUSE, HX OF 09/09/2010    Past Surgical History:  Procedure Laterality Date  . WISDOM TOOTH EXTRACTION         Home Medications    Prior to Admission medications   Medication Sig Start Date End Date Taking? Authorizing Provider  amLODipine (NORVASC) 10 MG tablet Take 1 tablet (10 mg total) by mouth daily. 08/03/17   Claiborne RiggFleming, Zelda W, NP  azithromycin (ZITHROMAX) 250 MG tablet Take 1 tablet (250 mg total) by mouth daily. Take first 2 tablets together, then 1 every day until finished. 10/31/17   Roxy HorsemanBrowning, Cadience Bradfield, PA-C  benzonatate  (TESSALON) 100 MG capsule Take 2 capsules (200 mg total) by mouth 2 (two) times daily as needed for cough. 10/31/17   Roxy HorsemanBrowning, Jakylan Ron, PA-C  cloNIDine (CATAPRES) 0.1 MG tablet Take 1 tablet (0.1 mg total) by mouth 2 (two) times daily. 08/03/17   Claiborne RiggFleming, Zelda W, NP  folic acid (FOLVITE) 1 MG tablet Take 1 tablet (1 mg total) by mouth daily. 07/03/17   Meredeth IdeLama, Gagan S, MD  ondansetron (ZOFRAN ODT) 4 MG disintegrating tablet Take 1 tablet (4 mg total) by mouth every 8 (eight) hours as needed for nausea or vomiting. 09/01/17   Mackuen, Courteney Lyn, MD  oxyCODONE-acetaminophen (PERCOCET/ROXICET) 5-325 MG tablet Take 1 tablet by mouth every 6 (six) hours as needed for severe pain. 09/01/17   Mackuen, Courteney Lyn, MD  thiamine 100 MG tablet Take 1 tablet (100 mg total) by mouth daily. 07/03/17   Meredeth IdeLama, Gagan S, MD    Family History Family History  Problem Relation Age of Onset  . Hypertension Mother   . Heart failure Father   . Diabetes Father   . Hypertension Brother     Social History Social History   Tobacco Use  . Smoking status: Current Every Day Smoker    Packs/day: 0.50    Years: 10.00    Pack years: 5.00    Types: Cigarettes  .  Smokeless tobacco: Never Used  Substance Use Topics  . Alcohol use: Yes    Comment: daily  . Drug use: Yes    Types: Marijuana    Comment: weekend only     Allergies   Patient has no known allergies.   Review of Systems Review of Systems  All other systems reviewed and are negative.    Physical Exam Updated Vital Signs BP (!) 142/93   Pulse 78   Temp 98.7 F (37.1 C) (Oral)   Resp 20   SpO2 98%   Physical Exam  Constitutional: He is oriented to person, place, and time. He appears well-developed and well-nourished.  HENT:  Head: Normocephalic and atraumatic.  Mildly erythematous oropharynx, no exudate or abscess  Eyes: Conjunctivae and EOM are normal. Pupils are equal, round, and reactive to light. Right eye exhibits no discharge. Left  eye exhibits no discharge. No scleral icterus.  Neck: Normal range of motion. Neck supple. No JVD present.  Cardiovascular: Normal rate, regular rhythm and normal heart sounds. Exam reveals no gallop and no friction rub.  No murmur heard. Pulmonary/Chest: Effort normal and breath sounds normal. No respiratory distress. He has no wheezes. He has no rales. He exhibits no tenderness.  CTAB  Abdominal: Soft. He exhibits no distension and no mass. There is no tenderness. There is no rebound and no guarding.  Musculoskeletal: Normal range of motion. He exhibits no edema or tenderness.  Neurological: He is alert and oriented to person, place, and time.  Skin: Skin is warm and dry.  Psychiatric: He has a normal mood and affect. His behavior is normal. Judgment and thought content normal.  Nursing note and vitals reviewed.    ED Treatments / Results  Labs (all labs ordered are listed, but only abnormal results are displayed) Labs Reviewed  CBC - Abnormal; Notable for the following components:      Result Value   RBC 3.90 (*)    HCT 37.7 (*)    MCH 34.1 (*)    All other components within normal limits  COMPREHENSIVE METABOLIC PANEL - Abnormal; Notable for the following components:   Chloride 98 (*)    Total Protein 8.5 (*)    Albumin 5.1 (*)    AST 82 (*)    All other components within normal limits    EKG  EKG Interpretation  Date/Time:  Friday October 30 2017 19:15:06 EST Ventricular Rate:  92 PR Interval:  142 QRS Duration: 86 QT Interval:  356 QTC Calculation: 440 R Axis:   79 Text Interpretation:  Normal sinus rhythm Left ventricular hypertrophy Abnormal ECG No old tracing to compare Confirmed by Linwood Dibbles [phone]) on 10/31/2017 12:22:45 AM       Radiology Dg Chest 2 View  Result Date: 10/30/2017 CLINICAL DATA:  Cough for 1 week. EXAM: CHEST  2 VIEW COMPARISON:  07/01/2017 FINDINGS: The heart size and mediastinal contours are within normal limits. Both lungs are clear. The  visualized skeletal structures are unremarkable. IMPRESSION: No active cardiopulmonary disease. Electronically Signed   By: Elige Ko   On: 10/30/2017 20:39    Procedures Procedures (including critical care time)  Medications Ordered in ED Medications  albuterol (PROVENTIL) (2.5 MG/3ML) 0.083% nebulizer solution 5 mg (not administered)  albuterol (PROVENTIL) (2.5 MG/3ML) 0.083% nebulizer solution (not administered)  albuterol (PROVENTIL HFA;VENTOLIN HFA) 108 (90 Base) MCG/ACT inhaler 2 puff (not administered)     Initial Impression / Assessment and Plan / ED Course  I have reviewed the  triage vital signs and the nursing notes.  Pertinent labs & imaging results that were available during my care of the patient were reviewed by me and considered in my medical decision making (see chart for details).     Patient with cough and cold symptoms times 1 week.  Denies any fevers or chills.  He has had some mild chest pain, but only when coughing.  Likely related to intercostal strain.  PERC negative.  Not hypoxic nor tachycardic.  Doubt PE.  Low risk for ACS given age and risk factors.  May benefit from a Z-Pak given length of illness.  Final Clinical Impressions(s) / ED Diagnoses   Final diagnoses:  Cough  Shortness of breath    ED Discharge Orders        Ordered    azithromycin (ZITHROMAX) 250 MG tablet  Daily     10/31/17 0007    benzonatate (TESSALON) 100 MG capsule  2 times daily PRN     10/31/17 0007       Roxy Horseman, PA-C 10/31/17 Erlene Quan, MD 10/31/17 [phone]

## 2017-11-02 ENCOUNTER — Ambulatory Visit: Payer: Self-pay | Attending: Nurse Practitioner | Admitting: Licensed Clinical Social Worker

## 2017-11-02 ENCOUNTER — Encounter: Payer: Self-pay | Admitting: Nurse Practitioner

## 2017-11-02 ENCOUNTER — Ambulatory Visit: Payer: Self-pay | Attending: Nurse Practitioner | Admitting: Nurse Practitioner

## 2017-11-02 VITALS — BP 149/90 | HR 99 | Temp 98.6°F | Resp 12 | Ht 68.0 in | Wt 154.2 lb

## 2017-11-02 DIAGNOSIS — F1020 Alcohol dependence, uncomplicated: Secondary | ICD-10-CM

## 2017-11-02 DIAGNOSIS — F1021 Alcohol dependence, in remission: Secondary | ICD-10-CM

## 2017-11-02 DIAGNOSIS — Z79891 Long term (current) use of opiate analgesic: Secondary | ICD-10-CM | POA: Insufficient documentation

## 2017-11-02 DIAGNOSIS — J069 Acute upper respiratory infection, unspecified: Secondary | ICD-10-CM | POA: Insufficient documentation

## 2017-11-02 DIAGNOSIS — I10 Essential (primary) hypertension: Secondary | ICD-10-CM | POA: Insufficient documentation

## 2017-11-02 DIAGNOSIS — Z833 Family history of diabetes mellitus: Secondary | ICD-10-CM | POA: Insufficient documentation

## 2017-11-02 DIAGNOSIS — Z8249 Family history of ischemic heart disease and other diseases of the circulatory system: Secondary | ICD-10-CM | POA: Insufficient documentation

## 2017-11-02 DIAGNOSIS — Z79899 Other long term (current) drug therapy: Secondary | ICD-10-CM | POA: Insufficient documentation

## 2017-11-02 MED ORDER — CLONIDINE HCL 0.1 MG PO TABS: 0.1000 mg | ORAL_TABLET | Freq: Three times a day (TID) | ORAL | 3 refills | Status: DC

## 2017-11-02 MED ORDER — AMLODIPINE BESYLATE 10 MG PO TABS: 10.0000 mg | ORAL_TABLET | Freq: Every day | ORAL | 2 refills | Status: DC

## 2017-11-02 MED ORDER — AZITHROMYCIN 250 MG PO TABS: 250.0000 mg | ORAL_TABLET | Freq: Every day | ORAL | 0 refills | Status: DC

## 2017-11-02 MED ORDER — BENZONATATE 100 MG PO CAPS: 200.0000 mg | ORAL_CAPSULE | Freq: Two times a day (BID) | ORAL | 0 refills | Status: DC | PRN

## 2017-11-02 MED ORDER — GLIMEPIRIDE 2 MG PO TABS: 2.0000 mg | ORAL_TABLET | Freq: Every day | ORAL | 3 refills | Status: DC

## 2017-11-02 NOTE — BH Specialist Note (Signed)
Integrated Behavioral Health Initial Visit  MRN: [id number] Name: Jordan Hudson  Number of Integrated Behavioral Health Clinician visits:: 1/6 Session Start time: 9:55 AM  Session End time: 10:25 AM Total time: 30 minutes  Type of Service: Integrated Behavioral Health- Individual/Family Interpretor:No. Interpretor Name and Language: N/A   Warm Hand Off Completed.       SUBJECTIVE: Jordan Hudson is a 32 y.o. male accompanied by self Patient was referred by NP Meredeth IdeFleming for substance use resources. Patient reports the following symptoms/concerns: decreased interest in doing things, difficulty sleeping, low energy, and ongoing substance use  Duration of problem: Ongoing; Severity of problem: severe  OBJECTIVE: Mood: Anxious and Affect: Appropriate Risk of harm to self or others: No plan to harm self or others  LIFE CONTEXT: Family and Social: Pt receives strong support from mother and siblings. He has an 89eight yo daughter School/Work: Pt is employed full time. He does not receive any public benefits; however, is interested in learning about veteran benefits that may benefit him in the future Self-Care: Pt reports use of alcohol (two 24 oz daily), marijuana (daily), and cigarettes (1pack per 3 days) Life Changes: Pt is interested in admitting self in a detox program.   GOALS ADDRESSED: Patient will: 1. Reduce symptoms of: anxiety and depression 2. Increase knowledge and/or ability of: coping skills and healthy habits  3. Demonstrate ability to: Increase adequate support systems for patient/family and Decrease self-medicating behaviors  INTERVENTIONS: Interventions utilized: Solution-Focused Strategies, Supportive Counseling, Psychoeducation and/or Health Education and Link to [facility]  Standardized Assessments completed: GAD-7 and PHQ 2&9  ASSESSMENT: Patient currently experiencing depression and anxiety triggered by ongoing substance use. He reports decreased  interest in doing things, difficulty sleeping, and low energy. Pt receives strong support from family and identified his love for his minor daughter as a motivational agent to initiate treatment.   Patient may benefit from psychoeducation, psychotherapy, and substance use treatment. He has good insight on how substance use is negatively impacting his functioning regarding his mental and physical health. Pt has hx of inpatient and outpatient treatment. LCSWA discussed treatment options. Pt is only interested in detox at this time. LCSWA provided local detox treatment programs and encouraged pt to contact this LCSWA once program is completed to discuss next steps to assist in maintaining sobriety. Pt was appreciative for assistance.   PLAN: 1. Follow up with behavioral health clinician on : Pt was encouraged to contact LCSWA if symptoms worsen or fail to improve to schedule behavioral appointments at [facility]. 2. Behavioral recommendations: LCSWA recommends that pt apply healthy coping skills discussed and utilize provided resources. Pt is encouraged to schedule follow up appointment with LCSWA 3. Referral(s): Substance Abuse Program 4. "From scale of 1-10, how likely are you to follow plan?": 10/10  Bridgett LarssonJasmine D Rondey Fallen, LCSW 11/03/17 12:18 PM

## 2017-11-02 NOTE — Progress Notes (Signed)
Assessment & Plan:  Jordan Hudson was seen today for blood pressure check.  Diagnoses and all orders for this visit:  Essential hypertension -     cloNIDine (CATAPRES) 0.1 MG tablet; Take 1 tablet (0.1 mg total) by mouth 3 (three) times daily. -     amLODipine (NORVASC) 10 MG tablet; Take 1 tablet (10 mg total) by mouth daily.  Continue all antihypertensives as prescribed.  Remember to bring in your blood pressure log with you for your follow up appointment.  DASH/Mediterranean Diets are healthier choices for HTN.   Personal history of alcoholism (HCC) Face to Face meeting with in house social worker today for resources.   Upper respiratory tract infection, unspecified type -     azithromycin (ZITHROMAX) 250 MG tablet; Take 1 tablet (250 mg total) by mouth daily. Take first 2 tablets together, then 1 every day until finished. -     benzonatate (TESSALON) 100 MG capsule; Take 2 capsules (200 mg total) by mouth 2 (two) times daily as needed for cough. INSTRUCTIONS: use a humidifier for nasal congestion Drink plenty of fluids, rest and wash hands frequently to avoid the spread of infection Alternate tylenol and Motrin for relief of fever  Patient has been counseled on age-appropriate routine health concerns for screening and prevention. These are reviewed and up-to-date. Referrals have been placed accordingly. Immunizations are up-to-date or declined.    Subjective:   Chief Complaint  Patient presents with  . Blood Pressure Check    Patient is here for a blood pressure recheck and stated he's been having tightness on his breath with shortness of breath.    HPI Jordan Hudson 32 y.o. male presents to office today for blood pressure follow up.    Essential Hypertension Chronic. Blood pressure is not well controlled. He endorses medication compliance taking clonidine 0.1mg  BID and amlodipine 10mg  daily.  Denies chest pain, palpitations, lightheadedness, dizziness, headaches or BLE  edema. Clonidine increased to 0.1mg  TID today.  BP Readings from Last 3 Encounters:  11/02/17 (!) 149/90  10/31/17 (!) 145/95  09/01/17 (!) 156/98    URI Seen in ED on 10/30/2017 with upper respiratory symptoms.  Patient was endorsing cough and shortness of breath at that time.  He was discharged home with Z-Pak and Tessalon Perles.  Today he has not picked up either prescription.  He is endorsing persistent cough, shortness of breath, and chest pain only with coughing.  I instructed him that he needed to fill his prescription for his Z-Pak and Tessalon Perles to see if this will help relieve some of his symptoms.  He does continue to smoke cigarettes.  He declines any smoking cessation aids today.  Substance abuse HE has a long-standing history of alcohol abuse.  He states he drinks when he gets bored or depressed.  He also drinks to help prevent withdrawal symptoms.  He states he does not like the way it feels when he does not drink so he is interested in a rehab center for substance abuse.  Will have to speak to the on-site social worker for additional resources.  Drinking helps to suppress those feelings.  He currently denies any suicidal ideation.  Review of Systems  Constitutional: Negative for fever, malaise/fatigue and weight loss.  HENT: Negative.  Negative for nosebleeds.   Eyes: Negative.  Negative for blurred vision, double vision and photophobia.  Respiratory: Positive for cough, sputum production and shortness of breath. Negative for wheezing.   Cardiovascular: Positive for chest pain (only  with coughing). Negative for palpitations, orthopnea, claudication and leg swelling.  Gastrointestinal: Negative.  Negative for heartburn, nausea and vomiting.  Musculoskeletal: Negative.  Negative for myalgias.  Neurological: Negative.  Negative for dizziness, focal weakness, seizures and headaches.  Psychiatric/Behavioral: Negative.  Negative for suicidal ideas.    Past Medical History:    Diagnosis Date  . Anxiety   . Depression    8 months  . ETOH abuse   . Hypertension     Past Surgical History:  Procedure Laterality Date  . WISDOM TOOTH EXTRACTION      Family History  Problem Relation Age of Onset  . Hypertension Mother   . Heart failure Father   . Diabetes Father   . Hypertension Brother     Social History Reviewed with no changes to be made today.   Outpatient Medications Prior to Visit  Medication Sig Dispense Refill  . amLODipine (NORVASC) 10 MG tablet Take 1 tablet (10 mg total) by mouth daily. 30 tablet 2  . cloNIDine (CATAPRES) 0.1 MG tablet Take 1 tablet (0.1 mg total) by mouth 2 (two) times daily. 60 tablet 3  . folic acid (FOLVITE) 1 MG tablet Take 1 tablet (1 mg total) by mouth daily. 30 tablet 1  . azithromycin (ZITHROMAX) 250 MG tablet Take 1 tablet (250 mg total) by mouth daily. Take first 2 tablets together, then 1 every day until finished. (Patient not taking: Reported on 11/02/2017) 6 tablet 0  . benzonatate (TESSALON) 100 MG capsule Take 2 capsules (200 mg total) by mouth 2 (two) times daily as needed for cough. (Patient not taking: Reported on 11/02/2017) 20 capsule 0  . ondansetron (ZOFRAN ODT) 4 MG disintegrating tablet Take 1 tablet (4 mg total) by mouth every 8 (eight) hours as needed for nausea or vomiting. (Patient not taking: Reported on 11/02/2017) 20 tablet 0  . oxyCODONE-acetaminophen (PERCOCET/ROXICET) 5-325 MG tablet Take 1 tablet by mouth every 6 (six) hours as needed for severe pain. (Patient not taking: Reported on 11/02/2017) 7 tablet 0  . thiamine 100 MG tablet Take 1 tablet (100 mg total) by mouth daily. (Patient not taking: Reported on 11/02/2017) 30 tablet 1   No facility-administered medications prior to visit.     No Known Allergies     Objective:    BP (!) 149/90 (BP Location: Left Arm, Patient Position: Sitting, Cuff Size: Normal)   Pulse 99   Temp 98.6 F (37 C) (Oral)   Ht 5\' 8"  (1.727 m)   Wt 154 lb 3.2 oz (69.9  kg)   SpO2 99%   BMI 23.45 kg/m  Wt Readings from Last 3 Encounters:  11/02/17 154 lb 3.2 oz (69.9 kg)  09/01/17 150 lb (68 kg)  08/31/17 150 lb (68 kg)    Physical Exam  Constitutional: He is oriented to person, place, and time. He appears well-developed and well-nourished. He is cooperative.  HENT:  Head: Normocephalic and atraumatic.  Right Ear: A middle ear effusion is present.  Left Ear: A middle ear effusion is present.  Nose: Mucosal edema and rhinorrhea present.  Mouth/Throat: Uvula is midline and mucous membranes are normal. Posterior oropharyngeal erythema present.  Eyes: EOM are normal.  Neck: Normal range of motion.  Cardiovascular: Normal rate, regular rhythm and normal heart sounds. Exam reveals no gallop and no friction rub.  No murmur heard. Pulmonary/Chest: Effort normal. No accessory muscle usage. No tachypnea. No respiratory distress. He has no decreased breath sounds. He has no wheezes. He has no  rhonchi. He has no rales. He exhibits no tenderness.  Abdominal: Soft. Bowel sounds are normal.  Musculoskeletal: Normal range of motion. He exhibits no edema.  Neurological: He is alert and oriented to person, place, and time. Coordination normal.  Skin: Skin is warm and dry.  Psychiatric: He has a normal mood and affect. His behavior is normal. Judgment and thought content normal.  Nursing note and vitals reviewed.      Patient has been counseled extensively about nutrition and exercise as well as the importance of adherence with medications and regular follow-up. The patient was given clear instructions to go to ER or return to medical center if symptoms don't improve, worsen or new problems develop. The patient verbalized understanding.   Follow-up: Return in about 2 weeks (around 11/16/2017) for BP recheck.   Claiborne Rigg, FNP-BC [facility] [place], [place] [phone]   11/03/2017, 11:21 PM

## 2017-11-02 NOTE — Patient Instructions (Addendum)

## 2017-11-03 ENCOUNTER — Encounter: Payer: Self-pay | Admitting: Nurse Practitioner

## 2017-11-16 ENCOUNTER — Other Ambulatory Visit: Payer: Self-pay | Admitting: Nurse Practitioner

## 2017-11-16 ENCOUNTER — Ambulatory Visit: Payer: Self-pay | Attending: Nurse Practitioner | Admitting: *Deleted

## 2017-11-16 VITALS — BP 144/93 | HR 86

## 2017-11-16 DIAGNOSIS — I10 Essential (primary) hypertension: Secondary | ICD-10-CM

## 2017-11-16 MED ORDER — CLONIDINE HCL 0.2 MG PO TABS: 0.2000 mg | ORAL_TABLET | Freq: Two times a day (BID) | ORAL | 3 refills | Status: DC

## 2017-11-16 MED FILL — cloNIDine HCL 0.2 MG TABS: 0.2 | 30 days supply | Qty: 60 | Fill #0

## 2017-11-16 NOTE — Progress Notes (Signed)
Patient has been switched to clonidine 0.2mg  BID. New script has been sent to the pharmacy.

## 2017-11-16 NOTE — Progress Notes (Signed)
Switched patient to 0.2mg  of clonidine BID. Please let him know script has been sent to the pharmacy. Thank You

## 2017-11-16 NOTE — Progress Notes (Signed)
Pt arrived to [facility] for blood pressure check.  He is alert and oriented. His last OV with PCP, his BP was 149/90.  Pt denies chest pain, SOB, HA, dizziness, or blurred vision. Verified medication with patient. Pt states medication was taken this morning. He admits that he takes Clonidine 0.1 mg at least twice daily, a few times out of the week. He admits that he forgets the mid day dose.  He is cutting back on ETOH consumption, stating that he  has not had any since Friday and that it has been hard but he wants to do this for his daughter and himself. He states he was going to detox, at [facility]. He does not feel that he can do this by himself.  He is states he is unable to sleep. He gets 1 hour of sleep in spurts. Denies hallucinations or thoughts of hurting himself.   Blood pressure reading: BP: 154/91 pulse: 87  and  BP: 144/93 pulse: 86

## 2017-11-18 ENCOUNTER — Telehealth: Payer: Self-pay | Admitting: *Deleted

## 2017-11-18 NOTE — Telephone Encounter (Signed)
Attempt to contact patient, no answer. Unable to leave message on voicemail. Message states it has not been set up.

## 2017-11-18 NOTE — Telephone Encounter (Addendum)
-----   Message from Claiborne RiggZelda W Fleming, NP sent at 11/16/2017 12:53 PM EDT -----   Switched patient to 0.2mg  of clonidine BID. Please let him know script has been sent to the pharmacy. Thank You    ----- Message ----- From: Guy FrancoBenjamin, Saniah Schroeter, RN Sent: 11/16/2017  11:11 AM To: Claiborne RiggZelda W Fleming, NP

## 2017-11-24 NOTE — Telephone Encounter (Signed)
Late entry: 11/18/2017 at 1508: pt aware of increase on Clonidine. Pt verbalized understanding.

## 2017-12-14 MED FILL — AMLODIPINE BESYLATE 10 MG T: 10 | 30 days supply | Qty: 30 | Fill #1

## 2017-12-14 MED FILL — cloNIDine HCL 0.1 MG TABS: 0.1 | 30 days supply | Qty: 60 | Fill #1

## 2017-12-24 ENCOUNTER — Inpatient Hospital Stay: Payer: Self-pay

## 2018-01-22 MED FILL — AMLODIPINE BESYLATE 10 MG T: 10 | 30 days supply | Qty: 30 | Fill #2

## 2018-01-22 MED FILL — cloNIDine HCL 0.1 MG TABS: 0.1 | 30 days supply | Qty: 60 | Fill #2

## 2018-03-03 MED FILL — cloNIDine HCL 0.1 MG TABS: 0.1 | 30 days supply | Qty: 60 | Fill #3

## 2018-03-03 MED FILL — AMLODIPINE BESYLATE 10 MG T: 10 | 30 days supply | Qty: 30 | Fill #0

## 2018-05-07 ENCOUNTER — Other Ambulatory Visit: Payer: Self-pay | Admitting: Nurse Practitioner

## 2018-05-07 DIAGNOSIS — I10 Essential (primary) hypertension: Secondary | ICD-10-CM

## 2019-08-17 IMAGING — DX DG CHEST 2V
2 series · 2 of 2 positions shown · non-contrast
Comparison: Chest x-ray of June 30, 2017

CLINICAL DATA: Follow-up of pneumonia

EXAM:
CHEST  2 VIEW

[chest pa]
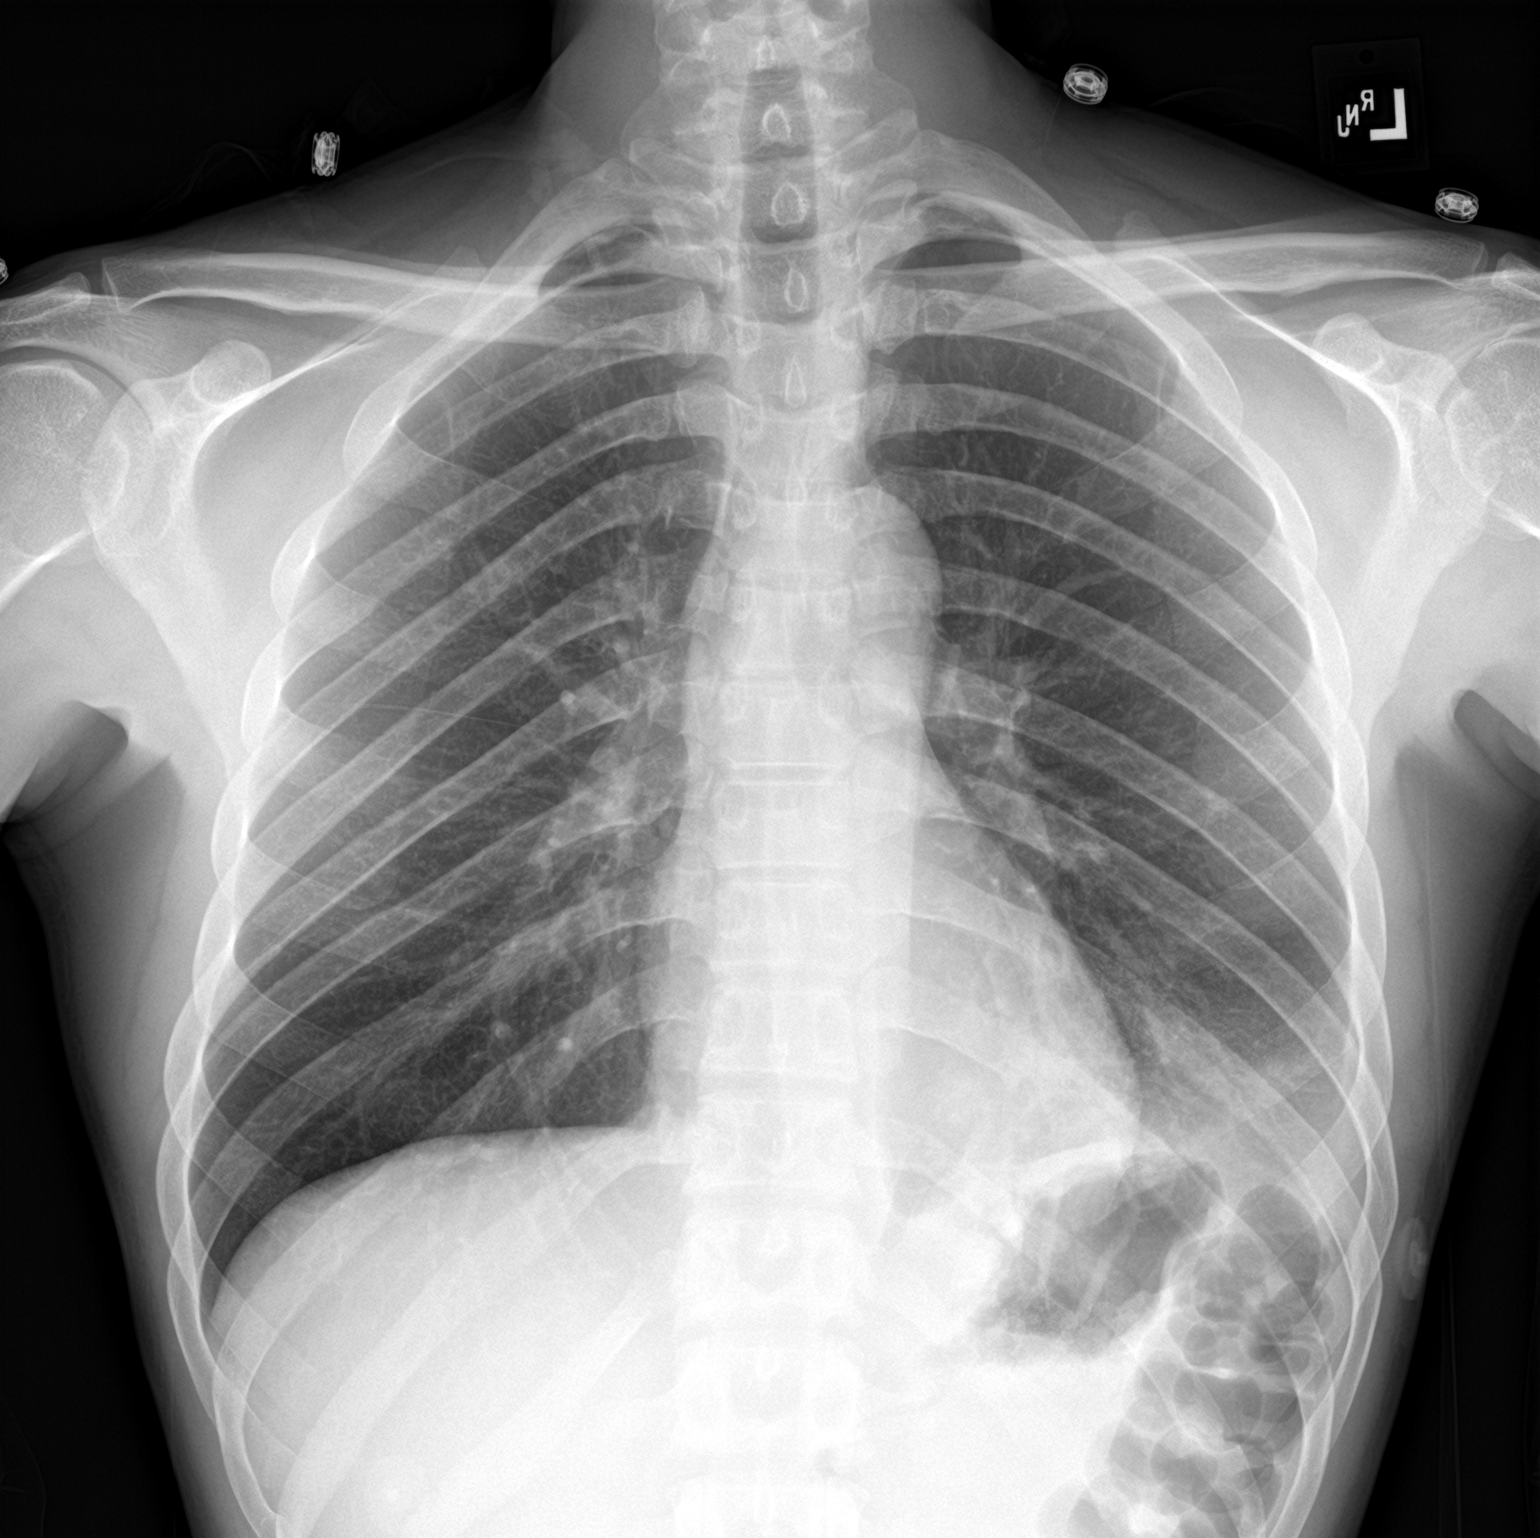

[chest lat]
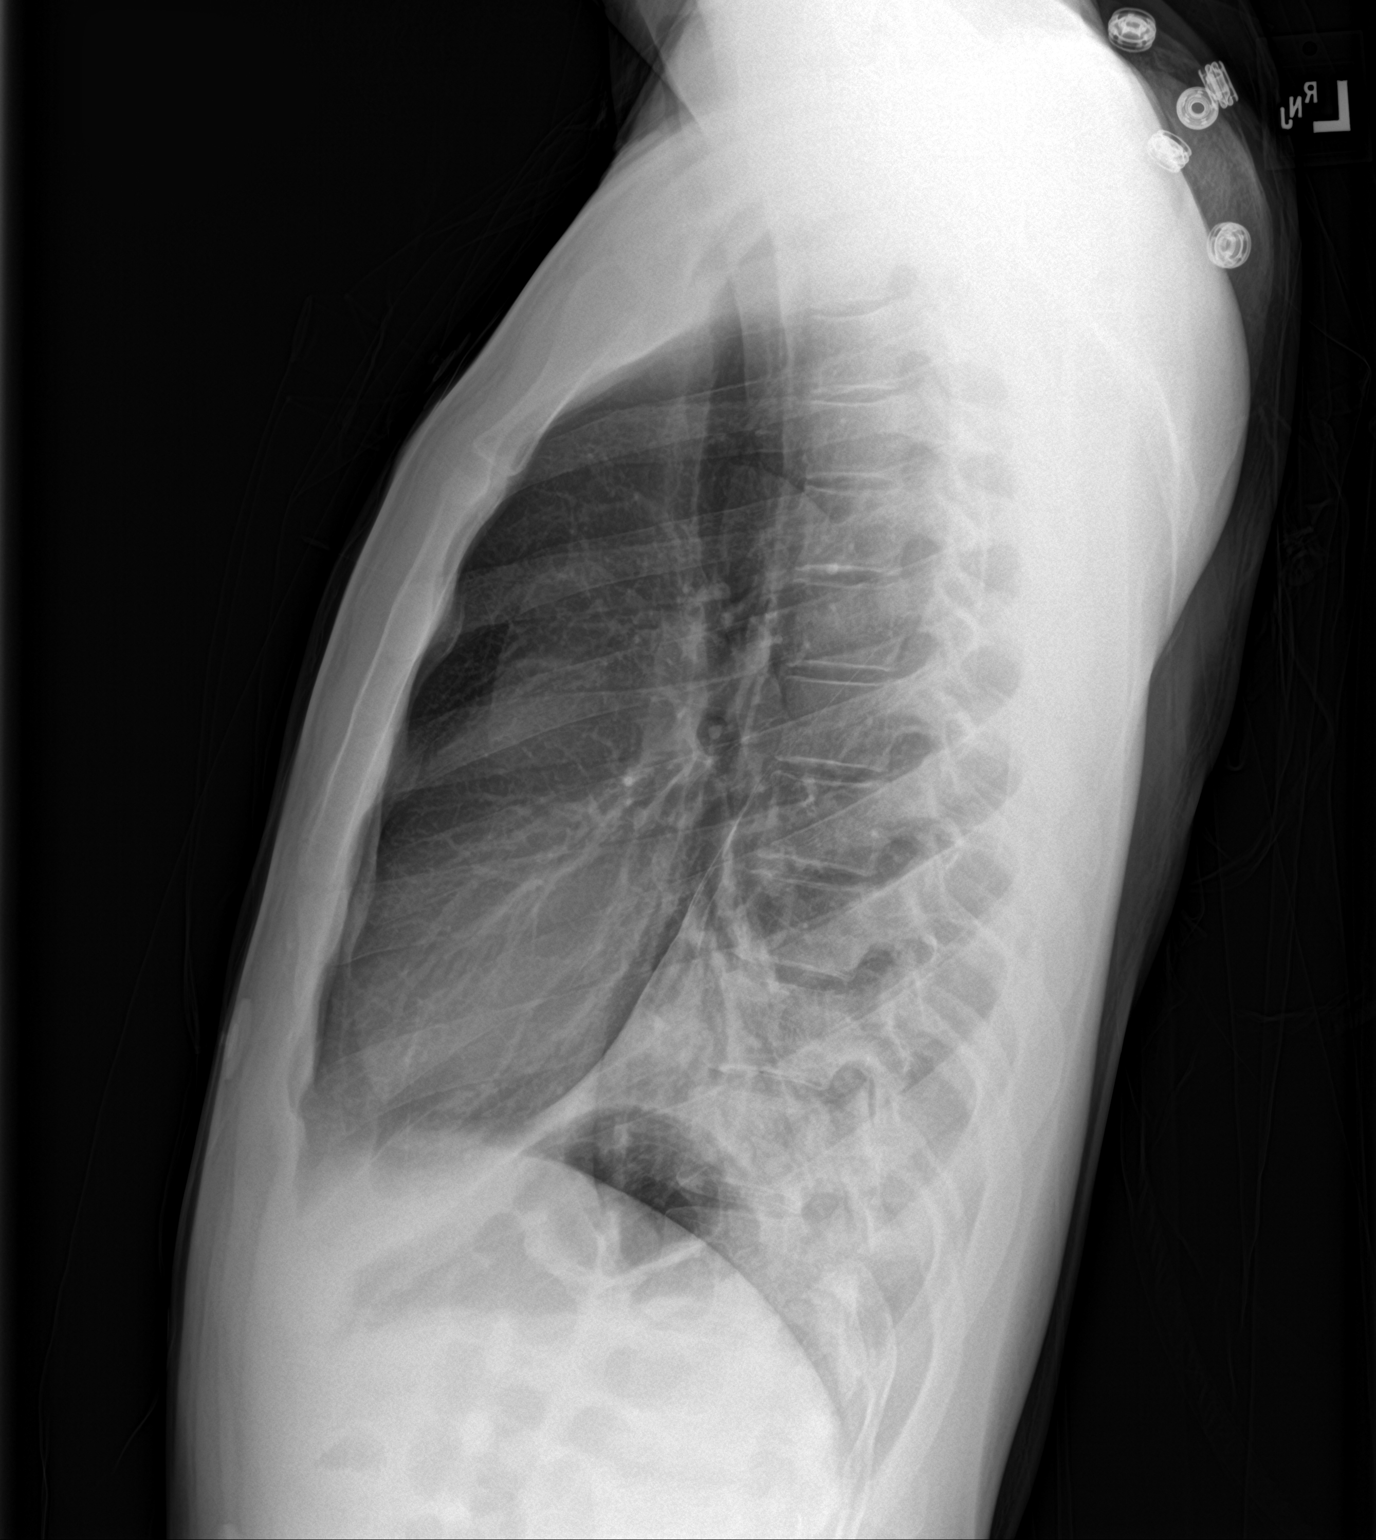

[2 of 2 positions shown; findings below may reference images not displayed]

FINDINGS: There is persistent left lower lobe infiltrate. A small amount of
pleural fluid on the left is suspected. There remainder of the left
lung is clear as is the right lung. The heart and mediastinal
structures are normal. The bony thorax is unremarkable.
IMPRESSION: Persistent left lower lobe pneumonia. There has been some
improvement since yesterday's study.

## 2021-11-13 ENCOUNTER — Other Ambulatory Visit: Payer: Self-pay

## 2021-11-13 ENCOUNTER — Emergency Department (HOSPITAL_COMMUNITY)
Admission: EM | Admit: 2021-11-13 | Discharge: 2021-11-13 | Disposition: A | Discharge status: Home / Self Care | Payer: No Typology Code available for payment source | Attending: Emergency Medicine | Admitting: Emergency Medicine

## 2021-11-13 ENCOUNTER — Encounter (HOSPITAL_COMMUNITY): Payer: Self-pay

## 2021-11-13 DIAGNOSIS — Y92410 Unspecified street and highway as the place of occurrence of the external cause: Secondary | ICD-10-CM | POA: Insufficient documentation

## 2021-11-13 DIAGNOSIS — Z79899 Other long term (current) drug therapy: Secondary | ICD-10-CM | POA: Insufficient documentation

## 2021-11-13 DIAGNOSIS — S61217A Laceration without foreign body of left little finger without damage to nail, initial encounter: Secondary | ICD-10-CM

## 2021-11-13 DIAGNOSIS — Z23 Encounter for immunization: Secondary | ICD-10-CM | POA: Insufficient documentation

## 2021-11-13 DIAGNOSIS — R030 Elevated blood-pressure reading, without diagnosis of hypertension: Secondary | ICD-10-CM

## 2021-11-13 DIAGNOSIS — S6992XA Unspecified injury of left wrist, hand and finger(s), initial encounter: Secondary | ICD-10-CM | POA: Diagnosis present

## 2021-11-13 MED ORDER — TETANUS-DIPHTH-ACELL PERTUSSIS 5-2.5-18.5 LF-MCG/0.5 IM SUSY
0.5000 mL | PREFILLED_SYRINGE | Freq: Once | INTRAMUSCULAR | Status: AC
Start: 2021-11-13 — End: 2021-11-13
  Administered 2021-11-13: 0.5 mL via INTRAMUSCULAR
  Filled 2021-11-13: qty 0.5

## 2021-11-13 NOTE — ED Notes (Signed)
Second party reports ETOH use. Pt states he drank beer last night. ?
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
## 2021-11-13 NOTE — Discharge Instructions (Signed)
Follow-up with your primary care provider for management of your blood pressure. ?

## 2021-11-14 ENCOUNTER — Encounter (HOSPITAL_COMMUNITY): Payer: Self-pay | Admitting: *Deleted

## 2021-11-14 ENCOUNTER — Ambulatory Visit (INDEPENDENT_AMBULATORY_CARE_PROVIDER_SITE_OTHER): Payer: No Typology Code available for payment source

## 2021-11-14 ENCOUNTER — Other Ambulatory Visit: Payer: Self-pay

## 2021-11-14 ENCOUNTER — Ambulatory Visit (HOSPITAL_COMMUNITY)
Admission: EM | Admit: 2021-11-14 | Discharge: 2021-11-14 | Disposition: A | Discharge status: Home / Self Care | Payer: No Typology Code available for payment source | Attending: Family Medicine | Admitting: Family Medicine

## 2021-11-14 DIAGNOSIS — M79642 Pain in left hand: Secondary | ICD-10-CM

## 2021-11-14 DIAGNOSIS — I10 Essential (primary) hypertension: Secondary | ICD-10-CM | POA: Diagnosis not present

## 2021-11-14 MED ORDER — DICLOFENAC SODIUM 75 MG PO TBEC: 75.0000 mg | DELAYED_RELEASE_TABLET | Freq: Two times a day (BID) | ORAL | 0 refills | Status: DC

## 2021-11-14 MED ORDER — AMLODIPINE BESYLATE 10 MG PO TABS: 10.0000 mg | ORAL_TABLET | Freq: Every day | ORAL | 2 refills | Status: DC

## 2021-11-14 NOTE — Discharge Instructions (Addendum)
Your x-rays did not show any broken bones of your left hand. If not allergic, you may use over the counter ibuprofen or acetaminophen as needed. ? ?Your blood pressure was noted to be elevated during your visit today. If you are currently taking medication for high blood pressure, please ensure you are taking this as directed. If you do not have a history of high blood pressure and your blood pressure remains persistently elevated, you may need to begin taking a medication at some point. You may return here within the next few days to recheck if unable to see your primary care provider or if you do not have a one. ? ?BP (!) 171/108   Pulse 78   Temp 98.8 ?F (37.1 ?C)   Resp 16   SpO2 98%  ? ?BP Readings from Last 3 Encounters:  ?11/14/21 (!) 171/108  ?11/13/21 (!) 167/99  ?11/16/17 (!) 144/93  ? ?
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
## 2022-01-08 ENCOUNTER — Ambulatory Visit: Payer: No Typology Code available for payment source | Admitting: Family Medicine

## 2022-01-24 ENCOUNTER — Encounter: Payer: Self-pay | Admitting: Family Medicine

## 2022-01-24 ENCOUNTER — Ambulatory Visit (INDEPENDENT_AMBULATORY_CARE_PROVIDER_SITE_OTHER): Payer: PRIVATE HEALTH INSURANCE | Admitting: Family Medicine

## 2022-01-24 VITALS — BP 110/67 | HR 70 | Resp 20 | Ht 68.0 in | Wt 153.4 lb

## 2022-01-24 DIAGNOSIS — Z7689 Persons encountering health services in other specified circumstances: Secondary | ICD-10-CM | POA: Diagnosis not present

## 2022-01-24 DIAGNOSIS — M79642 Pain in left hand: Secondary | ICD-10-CM

## 2022-01-24 DIAGNOSIS — I10 Essential (primary) hypertension: Secondary | ICD-10-CM

## 2022-01-24 LAB — COMPREHENSIVE METABOLIC PANEL
ALT: 6 U/L (ref 0–53)
AST: 14 U/L (ref 0–37)
Albumin: 4.5 g/dL (ref 3.5–5.2)
Alkaline Phosphatase: 50 U/L (ref 39–117)
BUN: 9 mg/dL (ref 6–23)
CO2: 29 mEq/L (ref 19–32)
Calcium: 9.8 mg/dL (ref 8.4–10.5)
Chloride: 104 mEq/L (ref 96–112)
Creatinine, Ser: 0.85 mg/dL (ref 0.40–1.50)
GFR: 111.76 mL/min (ref >60.00)
Glucose, Bld: 84 mg/dL (ref 70–99)
Potassium: 4.3 mEq/L (ref 3.5–5.1)
Sodium: 140 mEq/L (ref 135–145)
Total Bilirubin: 0.5 mg/dL (ref 0.2–1.2)
Total Protein: 7.1 g/dL (ref 6.0–8.3)

## 2022-01-24 LAB — CBC
HCT: 35.8 % — ABNORMAL LOW (ref 39.0–52.0)
Hemoglobin: 12.2 g/dL — ABNORMAL LOW (ref 13.0–17.0)
MCHC: 34.1 g/dL (ref 30.0–36.0)
MCV: 97.6 fl (ref 78.0–100.0)
Platelets: 383 10*3/uL (ref 150.0–400.0)
RBC: 3.67 Mil/uL — ABNORMAL LOW (ref 4.22–5.81)
RDW: 12.9 % (ref 11.5–15.5)
WBC: 8.6 10*3/uL (ref 4.0–10.5)

## 2022-01-24 LAB — LIPID PANEL
Cholesterol: 186 mg/dL (ref 0–200)
HDL: 61.8 mg/dL (ref >39.00)
LDL Cholesterol: 115 mg/dL — ABNORMAL HIGH (ref 0–99)
NonHDL: 124.63
Total CHOL/HDL Ratio: 3
Triglycerides: 50 mg/dL (ref 0.0–149.0)
VLDL: 10 mg/dL (ref 0.0–40.0)

## 2022-01-24 LAB — TSH: TSH: 0.79 u[IU]/mL (ref 0.35–5.50)

## 2022-01-24 MED ORDER — AMLODIPINE BESYLATE 10 MG PO TABS: 10.0000 mg | ORAL_TABLET | Freq: Every day | ORAL | 2 refills | Status: DC

## 2022-01-24 NOTE — Patient Instructions (Signed)
Thank you for choosing [facility] for your Primary Care needs. I am excited for the opportunity to partner with you to meet your health care goals. It was a pleasure meeting you today!   Information on diet, exercise, and health maintenance recommendations are listed below. This is information to help you be sure you are on track for optimal health and monitoring.   Please look over this and let us know if you have any questions or if you have completed any of the health maintenance outside of [facility] so that we can be sure your records are up to date.  ___________________________________________________________  MyChart:  For all urgent or time sensitive needs we ask that you please call the office to avoid delays. Our number is [phone]. MyChart is not constantly monitored and due to the large volume of messages a day, replies may take up to 72 business hours.  MyChart Policy: MyChart allows for you to see your visit notes, after visit summary, provider recommendations, lab and tests results, make an appointment, request refills, and contact your provider or the office for non-urgent questions or concerns. Providers are seeing patients during normal business hours and do not have built in time to review MyChart messages.  We ask that you allow a minimum of 3 business days for responses to MyChart messages. For this reason, please do not send urgent requests through MyChart. Please call the office at [phone]. New and ongoing conditions may require a visit. We have virtual and in-person visits available for your convenience.  Complex MyChart concerns may require a visit. Your provider may request you schedule a virtual or in-person visit to ensure we are providing the best care possible. MyChart messages sent after 11:00 AM on Friday will not be received by the provider until Monday morning.    Lab and Test Results: You will receive your lab and  test results on MyChart as soon as they are completed and results have been sent by the lab or testing facility. Due to this service, you will receive your results BEFORE your provider.  I review lab and test results each morning prior to seeing patients. Some results require collaboration with other providers to ensure you are receiving the most appropriate care. For this reason, we ask that you please allow a minimum of 3-5 business days from the time that ALL results have been received for your provider to receive and review lab and test results and contact you about these.  Most lab and test result comments from the provider will be sent through MyChart. Your provider may recommend changes to the plan of care, follow-up visits, repeat testing, ask questions, or request an office visit to discuss these results. You may reply directly to this message or call the office to provide information for the provider or set up an appointment. In some instances, you will be called with test results and recommendations. Please let us know if this is preferred and we will make note of this in your chart to provide this for you.    If you have not heard a response to your lab or test results in 5 business days from all results returning to MyChart, please call the office to let us know. We ask that you please avoid calling prior to this time unless there is an emergent concern. Due to high call volumes, this can delay the resulting process.  After Hours: For all non-emergency after hours needs,   please call the office at [phone] and select the option to reach the on-call  service. On-call services are shared between multiple [facility] offices and therefore it will not be possible to speak directly with your provider. On-call providers may provide medical advice and recommendations, but are unable to provide refills for maintenance medications.  For all emergency or urgent medical needs after normal business  hours, we recommend that you seek care at the closest Urgent Care or Emergency Department to ensure appropriate treatment in a timely manner.  [facility] has a 24 hour emergency room located on the ground floor for your convenience.   Urgent Concerns During the Business Day Providers are seeing patients from 8AM to 5PM with a busy schedule and are most often not able to respond to non-urgent calls until the end of the day or the next business day. If you should have URGENT concerns during the day, please call and speak to the nurse or schedule a same day appointment so that we can address your concern without delay.   Thank you, again, for choosing me as your health care partner. I appreciate your trust and look forward to learning more about you.   Arianny Pun B. Deep Bonawitz, DNP, FNP-C  ___________________________________________________________  Health Maintenance Recommendations Screening Testing Mammogram Every 1-2 years based on history and risk factors Starting at age 50 Pap Smear Ages 21-39 every 3 years Ages 30-65 every 5 years with HPV testing More frequent testing may be required based on results and history Colon Cancer Screening Every 1-10 years based on test performed, risk factors, and history Starting at age 45 Bone Density Screening Every 2-10 years based on history Starting at age 65 for women Recommendations for men differ based on medication usage, history, and risk factors AAA Screening One time ultrasound Men 65-75 years old who have ever smoked Lung Cancer Screening Low Dose Lung CT every 12 months Age 50-80 years with a 20 pack-year smoking history who still smoke or who have quit within the last 15 years  Screening Labs Routine  Labs: Complete Blood Count (CBC), Complete Metabolic Panel (CMP), Cholesterol (Lipid Panel) Every 6-12 months based on history and medications May be recommended more frequently based on current conditions or  previous results Hemoglobin A1c Lab Every 3-12 months based on history and previous results Starting at age 45 or earlier with diagnosis of diabetes, high cholesterol, BMI >26, and/or risk factors Frequent monitoring for patients with diabetes to ensure blood sugar control Thyroid Panel (TSH w/ T3 & T4) Every 6 months based on history, symptoms, and risk factors May be repeated more often if on medication HIV One time testing for all patients 13 and older May be repeated more frequently for patients with increased risk factors or exposure Hepatitis C One time testing for all patients 18 and older May be repeated more frequently for patients with increased risk factors or exposure Gonorrhea, Chlamydia Every 12 months for all sexually active persons 13-24 years Additional monitoring may be recommended for those who are considered high risk or who have symptoms PSA Men 40-54 years old with risk factors Additional screening may be recommended from age 55-69 based on risk factors, symptoms, and history  Vaccine Recommendations Tetanus Booster All adults every 10 years Flu Vaccine All patients 6 months and older every year COVID Vaccine All patients 12 years and older Initial dosing with booster May recommend additional booster based on age and health history HPV Vaccine 2 doses all patients   age 9-26 Dosing may be considered for patients over 26 Shingles Vaccine (Shingrix) 2 doses all adults 50 years and older Pneumonia (Pneumovax 23) All adults 65 years and older May recommend earlier dosing based on health history Pneumonia (Prevnar 13) All adults 65 years and older Dosed 1 year after Pneumovax 23 Pneumonia (Prevnar 20) All adults 65 years and older (adults 19-64 with certain conditions or risk factors) 1 dose  For those who have no received Prevnar 13 vaccine previously   Additional Screening, Testing, and Vaccinations may be recommended on an individualized basis based on  family history, health history, risk factors, and/or exposure.  __________________________________________________________  Diet Recommendations for All Patients  I recommend that all patients maintain a diet low in saturated fats, carbohydrates, and cholesterol. While this can be challenging at first, it is not impossible and small changes can make big differences.  Things to try: Decreasing the amount of soda, sweet tea, and/or juice to one or less per day and replace with water While water is always the first choice, if you do not like water you may consider adding a water additive without sugar to improve the taste other sugar free drinks Replace potatoes with a brightly colored vegetable at dinner Use healthy oils, such as canola oil or olive oil, instead of butter or hard margarine Limit your bread intake to two pieces or less a day Replace regular pasta with low carb pasta options Bake, broil, or grill foods instead of frying Monitor portion sizes  Eat smaller, more frequent meals throughout the day instead of large meals  An important thing to remember is, if you love foods that are not great for your health, you don't have to give them up completely. Instead, allow these foods to be a reward when you have done well. Allowing yourself to still have special treats every once in a while is a nice way to tell yourself thank you for working hard to keep yourself healthy.   Also remember that every day is a new day. If you have a bad day and "fall off the wagon", you can still climb right back up and keep moving along on your journey!  We have resources available to help you!  Some websites that may be helpful include: www.MyPlate.gov  Www.VeryWellFit.com _____________________________________________________________  Activity Recommendations for All Patients  I recommend that all adults get at least 20 minutes of moderate physical activity that elevates your heart rate at least 5  days out of the week.  Some examples include: Walking or jogging at a pace that allows you to carry on a conversation Cycling (stationary bike or outdoors) Water aerobics Yoga Weight lifting Dancing If physical limitations prevent you from putting stress on your joints, exercise in a pool or seated in a chair are excellent options.  Do determine your MAXIMUM heart rate for activity: 220 - YOUR AGE = MAX Heart Rate   Remember! Do not push yourself too hard.  Start slowly and build up your pace, speed, weight, time in exercise, etc.  Allow your body to rest between exercise and get good sleep. You will need more water than normal when you are exerting yourself. Do not wait until you are thirsty to drink. Drink with a purpose of getting in at least 8, 8 ounce glasses of water a day plus more depending on how much you exercise and sweat.    If you begin to develop dizziness, chest pain, abdominal pain, jaw pain, shortness of breath, headache,   vision changes, lightheadedness, or other concerning symptoms, stop the activity and allow your body to rest. If your symptoms are severe, seek emergency evaluation immediately. If your symptoms are concerning, but not severe, please let us know so that we can recommend further evaluation.     
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
## 2022-01-28 ENCOUNTER — Encounter: Payer: Self-pay | Admitting: Family Medicine

## 2022-02-03 NOTE — Progress Notes (Unsigned)
    Aleen Sells D.[facility] [address] [place] [address] Phone: [phone]   Assessment and Plan:     There are no diagnoses linked to this encounter.  ***   Pertinent previous records reviewed include ***   Follow Up: ***     Subjective:   I, Julieann Drummonds, am serving as a Neurosurgeon for Doctor Richardean Sale  Chief Complaint: left hand pain   HPI:  02/04/2022 Patient is a 36 year old male complaining of left hand pain. Patient states  Relevant Historical Information: ***  Additional pertinent review of systems negative.   Current Outpatient Medications:    amLODipine (NORVASC) 10 MG tablet, Take 1 tablet (10 mg total) by mouth daily., Disp: 30 tablet, Rfl: 2   Objective:     There were no vitals filed for this visit.    There is no height or weight on file to calculate BMI.    Physical Exam:    ***   Electronically signed by:  Aleen Sells D.[facility] 1:07 PM 02/03/22

## 2022-02-04 ENCOUNTER — Ambulatory Visit (INDEPENDENT_AMBULATORY_CARE_PROVIDER_SITE_OTHER): Payer: No Typology Code available for payment source

## 2022-02-04 ENCOUNTER — Ambulatory Visit (INDEPENDENT_AMBULATORY_CARE_PROVIDER_SITE_OTHER): Payer: PRIVATE HEALTH INSURANCE | Admitting: Sports Medicine

## 2022-02-04 VITALS — BP 110/72 | HR 67 | Ht 68.0 in | Wt 153.0 lb

## 2022-02-04 DIAGNOSIS — M79642 Pain in left hand: Secondary | ICD-10-CM | POA: Diagnosis not present

## 2022-02-04 MED ORDER — MELOXICAM 15 MG PO TABS: 15.0000 mg | ORAL_TABLET | Freq: Every day | ORAL | 0 refills | Status: AC

## 2022-02-04 NOTE — Patient Instructions (Addendum)
Good to see you - Start meloxicam 15 mg daily x2 weeks.  If still having pain after 2 weeks, complete 3rd-week of meloxicam. May use remaining meloxicam as needed once daily for pain control.  Do not to use additional NSAIDs while taking meloxicam.  May use Tylenol 500-1000 mg 2 to 3 times a day for breakthrough pain. 3 week follow up   
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
## 2022-03-05 ENCOUNTER — Ambulatory Visit: Payer: No Typology Code available for payment source | Admitting: Sports Medicine

## 2022-03-17 ENCOUNTER — Telehealth: Payer: Self-pay | Admitting: Family Medicine

## 2022-03-17 ENCOUNTER — Other Ambulatory Visit: Payer: Self-pay | Admitting: *Deleted

## 2022-03-17 DIAGNOSIS — I10 Essential (primary) hypertension: Secondary | ICD-10-CM

## 2022-03-17 MED ORDER — AMLODIPINE BESYLATE 10 MG PO TABS: 10.0000 mg | ORAL_TABLET | Freq: Every day | ORAL | 2 refills | Status: AC

## 2022-03-17 NOTE — Telephone Encounter (Signed)
Medication: amLODipine (NORVASC) 10 MG tablet   Has the patient contacted their pharmacy? No.  Preferred Pharmacy (with phone number or street name):  [facility] [phone] - [place], [place] - [address] AT [facility] [address] [facility] [address] [facility] [address] [facility] [address]  [address] [place] [address]  Phone:  [phone]  Fax:  [phone]   Agent: Please be advised that RX refills may take up to 3 business days. We ask that you follow-up with your pharmacy.

## 2022-03-17 NOTE — Telephone Encounter (Signed)
Refill sent.

## 2023-04-20 ENCOUNTER — Other Ambulatory Visit: Payer: Self-pay

## 2023-04-20 ENCOUNTER — Emergency Department (HOSPITAL_COMMUNITY)
Admission: EM | Admit: 2023-04-20 | Discharge: 2023-04-21 | Discharge status: Against Medical Advice | Payer: No Typology Code available for payment source | Attending: Emergency Medicine | Admitting: Emergency Medicine

## 2023-04-20 DIAGNOSIS — Z5321 Procedure and treatment not carried out due to patient leaving prior to being seen by health care provider: Secondary | ICD-10-CM | POA: Diagnosis not present

## 2023-04-20 DIAGNOSIS — R1031 Right lower quadrant pain: Secondary | ICD-10-CM | POA: Diagnosis present

## 2023-04-20 LAB — URINALYSIS, ROUTINE W REFLEX MICROSCOPIC
Bilirubin Urine: NEGATIVE
Glucose, UA: NEGATIVE mg/dL
Hgb urine dipstick: NEGATIVE
Ketones, ur: NEGATIVE mg/dL
Leukocytes,Ua: NEGATIVE
Nitrite: NEGATIVE
Protein, ur: NEGATIVE mg/dL
Specific Gravity, Urine: 1.023 (ref 1.005–1.030)
pH: 6 (ref 5.0–8.0)

## 2023-04-20 LAB — CBC
HCT: 35.7 % — ABNORMAL LOW (ref 39.0–52.0)
Hemoglobin: 12.5 g/dL — ABNORMAL LOW (ref 13.0–17.0)
MCH: 31.3 pg (ref 26.0–34.0)
MCHC: 35 g/dL (ref 30.0–36.0)
MCV: 89.5 fL (ref 80.0–100.0)
Platelets: 257 10*3/uL (ref 150–400)
RBC: 3.99 MIL/uL — ABNORMAL LOW (ref 4.22–5.81)
RDW: 13.6 % (ref 11.5–15.5)
WBC: 8.5 10*3/uL (ref 4.0–10.5)
nRBC: 0 % (ref 0.0–0.2)

## 2023-04-20 LAB — BASIC METABOLIC PANEL
Anion gap: 10 (ref 5–15)
BUN: 12 mg/dL (ref 6–20)
CO2: 24 mmol/L (ref 22–32)
Calcium: 8.9 mg/dL (ref 8.9–10.3)
Chloride: 103 mmol/L (ref 98–111)
Creatinine, Ser: 0.81 mg/dL (ref 0.61–1.24)
GFR, Estimated: >60 mL/min (ref >60)
Glucose, Bld: 94 mg/dL (ref 70–99)
Potassium: 3.1 mmol/L — ABNORMAL LOW (ref 3.5–5.1)
Sodium: 137 mmol/L (ref 135–145)

## 2023-04-20 LAB — HEPATIC FUNCTION PANEL
ALT: 18 U/L (ref 0–44)
AST: 23 U/L (ref 15–41)
Albumin: 4.1 g/dL (ref 3.5–5.0)
Alkaline Phosphatase: 46 U/L (ref 38–126)
Bilirubin, Direct: <0.1 mg/dL (ref 0.0–0.2)
Total Bilirubin: 0.3 mg/dL (ref 0.3–1.2)
Total Protein: 6.8 g/dL (ref 6.5–8.1)

## 2023-04-20 NOTE — ED Triage Notes (Signed)
Patient coming to ED for evaluation of pain to R flank.  Patient reports pain worse with coughing.  No reports of injury.  No hx of kidney stones.  Pain directly related to movement or cough.

## 2023-04-21 NOTE — ED Notes (Signed)
Pt called in lobby for vitals reassessment, no answer x1. [employer]

## 2023-04-21 NOTE — ED Notes (Signed)
Pt called in lobby for vitals reassessment, no answer x2. [employer]
